# Patient Record
Sex: Female | Born: 1945 | Race: Black or African American | Hispanic: No | Marital: Married | Smoking: Former smoker
Health system: Southern US, Community
[De-identification: ages and names within clinical notes are randomized; demographics above are authoritative.]

## PROBLEM LIST (undated history)

## (undated) DIAGNOSIS — I10 Essential (primary) hypertension: Secondary | ICD-10-CM

## (undated) DIAGNOSIS — R7303 Prediabetes: Secondary | ICD-10-CM

## (undated) DIAGNOSIS — E039 Hypothyroidism, unspecified: Secondary | ICD-10-CM

## (undated) HISTORY — PX: BREAST EXCISIONAL BIOPSY: SUR124

---

## 1991-05-10 HISTORY — PX: BREAST EXCISIONAL BIOPSY: SUR124

## 1993-05-09 HISTORY — PX: BREAST EXCISIONAL BIOPSY: SUR124

## 1998-01-28 ENCOUNTER — Ambulatory Visit (HOSPITAL_COMMUNITY): Admission: RE | Admit: 1998-01-28 | Discharge: 1998-01-28 | Payer: Self-pay | Admitting: Family Medicine

## 1998-01-29 ENCOUNTER — Encounter: Payer: Self-pay | Admitting: Family Medicine

## 1998-02-25 ENCOUNTER — Ambulatory Visit (HOSPITAL_COMMUNITY): Admission: RE | Admit: 1998-02-25 | Discharge: 1998-02-25 | Payer: Self-pay | Admitting: Family Medicine

## 1998-02-25 ENCOUNTER — Encounter: Payer: Self-pay | Admitting: Family Medicine

## 1998-04-01 ENCOUNTER — Encounter: Payer: Self-pay | Admitting: Surgery

## 1998-04-01 ENCOUNTER — Observation Stay (HOSPITAL_COMMUNITY): Admission: RE | Admit: 1998-04-01 | Discharge: 1998-04-02 | Payer: Self-pay | Admitting: Surgery

## 1999-06-18 ENCOUNTER — Encounter: Payer: Self-pay | Admitting: Family Medicine

## 1999-06-18 ENCOUNTER — Encounter: Admission: RE | Admit: 1999-06-18 | Discharge: 1999-06-18 | Payer: Self-pay | Admitting: Family Medicine

## 1999-12-20 ENCOUNTER — Encounter: Payer: Self-pay | Admitting: Family Medicine

## 1999-12-20 ENCOUNTER — Encounter: Admission: RE | Admit: 1999-12-20 | Discharge: 1999-12-20 | Payer: Self-pay | Admitting: Surgery

## 2001-01-11 ENCOUNTER — Encounter: Payer: Self-pay | Admitting: Family Medicine

## 2001-01-11 ENCOUNTER — Encounter: Admission: RE | Admit: 2001-01-11 | Discharge: 2001-01-11 | Payer: Self-pay | Admitting: Family Medicine

## 2002-01-16 ENCOUNTER — Encounter: Payer: Self-pay | Admitting: Family Medicine

## 2002-01-16 ENCOUNTER — Encounter: Admission: RE | Admit: 2002-01-16 | Discharge: 2002-01-16 | Payer: Self-pay | Admitting: Family Medicine

## 2002-11-06 ENCOUNTER — Encounter: Admission: RE | Admit: 2002-11-06 | Discharge: 2003-02-04 | Payer: Self-pay | Admitting: Family Medicine

## 2003-01-20 ENCOUNTER — Encounter: Payer: Self-pay | Admitting: Family Medicine

## 2003-01-20 ENCOUNTER — Encounter: Admission: RE | Admit: 2003-01-20 | Discharge: 2003-01-20 | Payer: Self-pay | Admitting: Family Medicine

## 2004-01-23 ENCOUNTER — Encounter: Admission: RE | Admit: 2004-01-23 | Discharge: 2004-01-23 | Payer: Self-pay | Admitting: Family Medicine

## 2004-01-28 ENCOUNTER — Encounter: Admission: RE | Admit: 2004-01-28 | Discharge: 2004-01-28 | Payer: Self-pay | Admitting: Family Medicine

## 2004-02-23 ENCOUNTER — Other Ambulatory Visit: Admission: RE | Admit: 2004-02-23 | Discharge: 2004-02-23 | Payer: Self-pay | Admitting: Family Medicine

## 2004-07-12 ENCOUNTER — Encounter: Admission: RE | Admit: 2004-07-12 | Discharge: 2004-07-12 | Payer: Self-pay | Admitting: Family Medicine

## 2004-12-27 ENCOUNTER — Encounter: Admission: RE | Admit: 2004-12-27 | Discharge: 2004-12-27 | Payer: Self-pay | Admitting: Family Medicine

## 2005-01-21 ENCOUNTER — Encounter: Admission: RE | Admit: 2005-01-21 | Discharge: 2005-01-21 | Payer: Self-pay | Admitting: Family Medicine

## 2005-03-22 ENCOUNTER — Other Ambulatory Visit: Admission: RE | Admit: 2005-03-22 | Discharge: 2005-03-22 | Payer: Self-pay | Admitting: Family Medicine

## 2006-01-23 ENCOUNTER — Encounter: Admission: RE | Admit: 2006-01-23 | Discharge: 2006-01-23 | Payer: Self-pay | Admitting: Family Medicine

## 2006-01-30 ENCOUNTER — Encounter: Admission: RE | Admit: 2006-01-30 | Discharge: 2006-01-30 | Payer: Self-pay | Admitting: Family Medicine

## 2006-02-03 ENCOUNTER — Emergency Department (HOSPITAL_COMMUNITY): Admission: EM | Admit: 2006-02-03 | Discharge: 2006-02-03 | Payer: Self-pay | Admitting: Family Medicine

## 2006-03-27 ENCOUNTER — Other Ambulatory Visit: Admission: RE | Admit: 2006-03-27 | Discharge: 2006-03-27 | Payer: Self-pay | Admitting: Family Medicine

## 2006-07-10 ENCOUNTER — Encounter: Admission: RE | Admit: 2006-07-10 | Discharge: 2006-07-10 | Payer: Self-pay | Admitting: Family Medicine

## 2007-01-26 ENCOUNTER — Encounter: Admission: RE | Admit: 2007-01-26 | Discharge: 2007-01-26 | Payer: Self-pay | Admitting: Family Medicine

## 2008-01-28 ENCOUNTER — Encounter: Admission: RE | Admit: 2008-01-28 | Discharge: 2008-01-28 | Payer: Self-pay | Admitting: Family Medicine

## 2008-04-07 ENCOUNTER — Other Ambulatory Visit: Admission: RE | Admit: 2008-04-07 | Discharge: 2008-04-07 | Payer: Self-pay | Admitting: Family Medicine

## 2009-01-28 ENCOUNTER — Encounter: Admission: RE | Admit: 2009-01-28 | Discharge: 2009-01-28 | Payer: Self-pay | Admitting: Family Medicine

## 2009-02-05 ENCOUNTER — Encounter: Admission: RE | Admit: 2009-02-05 | Discharge: 2009-02-05 | Payer: Self-pay | Admitting: Family Medicine

## 2009-04-09 ENCOUNTER — Other Ambulatory Visit: Admission: RE | Admit: 2009-04-09 | Discharge: 2009-04-09 | Payer: Self-pay | Admitting: Family Medicine

## 2010-02-08 ENCOUNTER — Encounter: Admission: RE | Admit: 2010-02-08 | Discharge: 2010-02-08 | Payer: Self-pay | Admitting: Family Medicine

## 2010-05-30 ENCOUNTER — Encounter: Payer: Self-pay | Admitting: Family Medicine

## 2011-01-03 ENCOUNTER — Other Ambulatory Visit: Payer: Self-pay | Admitting: Family Medicine

## 2011-01-03 DIAGNOSIS — Z1231 Encounter for screening mammogram for malignant neoplasm of breast: Secondary | ICD-10-CM

## 2011-02-11 ENCOUNTER — Ambulatory Visit
Admission: RE | Admit: 2011-02-11 | Discharge: 2011-02-11 | Disposition: A | Discharge status: Home / Self Care | Payer: Managed Care, Other (non HMO) | Source: Ambulatory Visit | Attending: Family Medicine | Admitting: Family Medicine

## 2011-02-11 DIAGNOSIS — Z1231 Encounter for screening mammogram for malignant neoplasm of breast: Secondary | ICD-10-CM

## 2012-01-02 ENCOUNTER — Other Ambulatory Visit: Payer: Self-pay | Admitting: Family Medicine

## 2012-01-02 DIAGNOSIS — Z1231 Encounter for screening mammogram for malignant neoplasm of breast: Secondary | ICD-10-CM

## 2012-02-13 ENCOUNTER — Ambulatory Visit
Admission: RE | Admit: 2012-02-13 | Discharge: 2012-02-13 | Disposition: A | Discharge status: Home / Self Care | Payer: Medicare Other | Source: Ambulatory Visit | Attending: Family Medicine | Admitting: Family Medicine

## 2012-02-13 DIAGNOSIS — Z1231 Encounter for screening mammogram for malignant neoplasm of breast: Secondary | ICD-10-CM

## 2013-01-14 ENCOUNTER — Other Ambulatory Visit: Payer: Self-pay

## 2013-01-14 DIAGNOSIS — Z1231 Encounter for screening mammogram for malignant neoplasm of breast: Secondary | ICD-10-CM

## 2013-02-13 ENCOUNTER — Ambulatory Visit
Admission: RE | Admit: 2013-02-13 | Discharge: 2013-02-13 | Disposition: A | Discharge status: Home / Self Care | Payer: Medicare Other | Source: Ambulatory Visit

## 2013-02-13 DIAGNOSIS — Z1231 Encounter for screening mammogram for malignant neoplasm of breast: Secondary | ICD-10-CM

## 2014-01-14 ENCOUNTER — Other Ambulatory Visit: Payer: Self-pay

## 2014-01-14 DIAGNOSIS — Z1231 Encounter for screening mammogram for malignant neoplasm of breast: Secondary | ICD-10-CM

## 2014-02-14 ENCOUNTER — Ambulatory Visit
Admission: RE | Admit: 2014-02-14 | Discharge: 2014-02-14 | Disposition: A | Discharge status: Home / Self Care | Payer: Medicare Other | Source: Ambulatory Visit

## 2014-02-14 DIAGNOSIS — Z1231 Encounter for screening mammogram for malignant neoplasm of breast: Secondary | ICD-10-CM

## 2015-01-09 ENCOUNTER — Other Ambulatory Visit: Payer: Self-pay

## 2015-01-09 DIAGNOSIS — Z1231 Encounter for screening mammogram for malignant neoplasm of breast: Secondary | ICD-10-CM

## 2015-02-19 ENCOUNTER — Ambulatory Visit
Admission: RE | Admit: 2015-02-19 | Discharge: 2015-02-19 | Disposition: A | Discharge status: Home / Self Care | Payer: Medicare Other | Source: Ambulatory Visit

## 2015-02-19 DIAGNOSIS — Z1231 Encounter for screening mammogram for malignant neoplasm of breast: Secondary | ICD-10-CM

## 2016-01-19 ENCOUNTER — Other Ambulatory Visit: Payer: Self-pay | Admitting: Family Medicine

## 2016-01-19 DIAGNOSIS — Z1231 Encounter for screening mammogram for malignant neoplasm of breast: Secondary | ICD-10-CM

## 2016-02-22 ENCOUNTER — Encounter: Payer: Self-pay | Admitting: Radiology

## 2016-02-22 ENCOUNTER — Ambulatory Visit
Admission: RE | Admit: 2016-02-22 | Discharge: 2016-02-22 | Disposition: A | Discharge status: Home / Self Care | Payer: Medicare Other | Source: Ambulatory Visit | Attending: Family Medicine | Admitting: Family Medicine

## 2016-02-22 DIAGNOSIS — Z1231 Encounter for screening mammogram for malignant neoplasm of breast: Secondary | ICD-10-CM

## 2016-05-27 ENCOUNTER — Ambulatory Visit
Admission: RE | Admit: 2016-05-27 | Discharge: 2016-05-27 | Disposition: A | Discharge status: Home / Self Care | Payer: Medicare Other | Source: Ambulatory Visit | Attending: Physician Assistant | Admitting: Physician Assistant

## 2016-05-27 ENCOUNTER — Other Ambulatory Visit: Payer: Self-pay | Admitting: Physician Assistant

## 2016-05-27 DIAGNOSIS — T1490XA Injury, unspecified, initial encounter: Secondary | ICD-10-CM

## 2017-01-12 ENCOUNTER — Other Ambulatory Visit: Payer: Self-pay | Admitting: Family Medicine

## 2017-01-12 DIAGNOSIS — Z1231 Encounter for screening mammogram for malignant neoplasm of breast: Secondary | ICD-10-CM

## 2017-02-22 ENCOUNTER — Ambulatory Visit
Admission: RE | Admit: 2017-02-22 | Discharge: 2017-02-22 | Disposition: A | Discharge status: Home / Self Care | Payer: Medicare Other | Source: Ambulatory Visit | Attending: Family Medicine | Admitting: Family Medicine

## 2017-02-22 DIAGNOSIS — Z1231 Encounter for screening mammogram for malignant neoplasm of breast: Secondary | ICD-10-CM

## 2017-05-15 DIAGNOSIS — R7303 Prediabetes: Secondary | ICD-10-CM | POA: Diagnosis not present

## 2017-05-15 DIAGNOSIS — E669 Obesity, unspecified: Secondary | ICD-10-CM | POA: Diagnosis not present

## 2017-05-15 DIAGNOSIS — E7800 Pure hypercholesterolemia, unspecified: Secondary | ICD-10-CM | POA: Diagnosis not present

## 2017-05-15 DIAGNOSIS — N183 Chronic kidney disease, stage 3 (moderate): Secondary | ICD-10-CM | POA: Diagnosis not present

## 2017-05-15 DIAGNOSIS — I129 Hypertensive chronic kidney disease with stage 1 through stage 4 chronic kidney disease, or unspecified chronic kidney disease: Secondary | ICD-10-CM | POA: Diagnosis not present

## 2017-05-15 DIAGNOSIS — E039 Hypothyroidism, unspecified: Secondary | ICD-10-CM | POA: Diagnosis not present

## 2017-05-15 DIAGNOSIS — M8580 Other specified disorders of bone density and structure, unspecified site: Secondary | ICD-10-CM | POA: Diagnosis not present

## 2017-06-05 DIAGNOSIS — H524 Presbyopia: Secondary | ICD-10-CM | POA: Diagnosis not present

## 2017-06-05 DIAGNOSIS — H2513 Age-related nuclear cataract, bilateral: Secondary | ICD-10-CM | POA: Diagnosis not present

## 2017-06-05 DIAGNOSIS — H52203 Unspecified astigmatism, bilateral: Secondary | ICD-10-CM | POA: Diagnosis not present

## 2017-06-05 DIAGNOSIS — E119 Type 2 diabetes mellitus without complications: Secondary | ICD-10-CM | POA: Diagnosis not present

## 2017-07-12 DIAGNOSIS — M8588 Other specified disorders of bone density and structure, other site: Secondary | ICD-10-CM | POA: Diagnosis not present

## 2017-09-26 DIAGNOSIS — Z0100 Encounter for examination of eyes and vision without abnormal findings: Secondary | ICD-10-CM | POA: Diagnosis not present

## 2017-10-26 DIAGNOSIS — M810 Age-related osteoporosis without current pathological fracture: Secondary | ICD-10-CM | POA: Diagnosis not present

## 2017-10-26 DIAGNOSIS — Z0000 Encounter for general adult medical examination without abnormal findings: Secondary | ICD-10-CM | POA: Diagnosis not present

## 2017-10-26 DIAGNOSIS — I129 Hypertensive chronic kidney disease with stage 1 through stage 4 chronic kidney disease, or unspecified chronic kidney disease: Secondary | ICD-10-CM | POA: Diagnosis not present

## 2017-10-26 DIAGNOSIS — E039 Hypothyroidism, unspecified: Secondary | ICD-10-CM | POA: Diagnosis not present

## 2017-10-26 DIAGNOSIS — Z1389 Encounter for screening for other disorder: Secondary | ICD-10-CM | POA: Diagnosis not present

## 2017-10-26 DIAGNOSIS — G609 Hereditary and idiopathic neuropathy, unspecified: Secondary | ICD-10-CM | POA: Diagnosis not present

## 2017-10-26 DIAGNOSIS — E7800 Pure hypercholesterolemia, unspecified: Secondary | ICD-10-CM | POA: Diagnosis not present

## 2017-10-26 DIAGNOSIS — R7309 Other abnormal glucose: Secondary | ICD-10-CM | POA: Diagnosis not present

## 2017-10-26 DIAGNOSIS — N183 Chronic kidney disease, stage 3 (moderate): Secondary | ICD-10-CM | POA: Diagnosis not present

## 2018-01-01 DIAGNOSIS — E039 Hypothyroidism, unspecified: Secondary | ICD-10-CM | POA: Diagnosis not present

## 2018-01-01 DIAGNOSIS — N183 Chronic kidney disease, stage 3 (moderate): Secondary | ICD-10-CM | POA: Diagnosis not present

## 2018-01-12 ENCOUNTER — Other Ambulatory Visit: Payer: Self-pay | Admitting: Family Medicine

## 2018-01-12 DIAGNOSIS — Z1231 Encounter for screening mammogram for malignant neoplasm of breast: Secondary | ICD-10-CM

## 2018-02-09 DIAGNOSIS — Z23 Encounter for immunization: Secondary | ICD-10-CM | POA: Diagnosis not present

## 2018-02-26 ENCOUNTER — Ambulatory Visit
Admission: RE | Admit: 2018-02-26 | Discharge: 2018-02-26 | Disposition: A | Discharge status: Home / Self Care | Payer: Medicare HMO | Source: Ambulatory Visit | Attending: Family Medicine | Admitting: Family Medicine

## 2018-02-26 DIAGNOSIS — Z1231 Encounter for screening mammogram for malignant neoplasm of breast: Secondary | ICD-10-CM

## 2018-03-14 DIAGNOSIS — K625 Hemorrhage of anus and rectum: Secondary | ICD-10-CM | POA: Diagnosis not present

## 2019-01-09 ENCOUNTER — Other Ambulatory Visit: Payer: Self-pay

## 2019-01-09 DIAGNOSIS — Z20822 Contact with and (suspected) exposure to covid-19: Secondary | ICD-10-CM

## 2019-01-10 LAB — NOVEL CORONAVIRUS, NAA: SARS-CoV-2, NAA: NOT DETECTED

## 2019-01-11 ENCOUNTER — Telehealth: Payer: Self-pay | Admitting: Family Medicine

## 2019-01-11 NOTE — Telephone Encounter (Signed)
Negative COVID results given. Patient results "NOT Detected." Caller expressed understanding. ° °

## 2019-01-18 ENCOUNTER — Other Ambulatory Visit: Payer: Self-pay | Admitting: Family Medicine

## 2019-01-18 DIAGNOSIS — Z1231 Encounter for screening mammogram for malignant neoplasm of breast: Secondary | ICD-10-CM

## 2019-03-06 ENCOUNTER — Other Ambulatory Visit: Payer: Self-pay

## 2019-03-06 ENCOUNTER — Ambulatory Visit
Admission: RE | Admit: 2019-03-06 | Discharge: 2019-03-06 | Disposition: A | Discharge status: Home / Self Care | Payer: Medicare Other | Source: Ambulatory Visit | Attending: Family Medicine | Admitting: Family Medicine

## 2019-03-06 DIAGNOSIS — Z1231 Encounter for screening mammogram for malignant neoplasm of breast: Secondary | ICD-10-CM

## 2019-03-08 ENCOUNTER — Other Ambulatory Visit: Payer: Self-pay | Admitting: Family Medicine

## 2019-03-08 DIAGNOSIS — R928 Other abnormal and inconclusive findings on diagnostic imaging of breast: Secondary | ICD-10-CM

## 2019-03-13 ENCOUNTER — Ambulatory Visit
Admission: RE | Admit: 2019-03-13 | Discharge: 2019-03-13 | Disposition: A | Discharge status: Home / Self Care | Payer: Medicare Other | Source: Ambulatory Visit | Attending: Family Medicine | Admitting: Family Medicine

## 2019-03-13 ENCOUNTER — Other Ambulatory Visit: Payer: Self-pay | Admitting: Family Medicine

## 2019-03-13 ENCOUNTER — Other Ambulatory Visit: Payer: Self-pay

## 2019-03-13 DIAGNOSIS — R928 Other abnormal and inconclusive findings on diagnostic imaging of breast: Secondary | ICD-10-CM

## 2019-03-13 DIAGNOSIS — N6310 Unspecified lump in the right breast, unspecified quadrant: Secondary | ICD-10-CM

## 2019-03-19 ENCOUNTER — Ambulatory Visit
Admission: RE | Admit: 2019-03-19 | Discharge: 2019-03-19 | Disposition: A | Discharge status: Home / Self Care | Payer: Medicare Other | Source: Ambulatory Visit | Attending: Family Medicine | Admitting: Family Medicine

## 2019-03-19 ENCOUNTER — Other Ambulatory Visit: Payer: Self-pay

## 2019-03-19 DIAGNOSIS — N6310 Unspecified lump in the right breast, unspecified quadrant: Secondary | ICD-10-CM

## 2019-03-19 HISTORY — PX: BREAST BIOPSY: SHX20

## 2019-03-29 ENCOUNTER — Ambulatory Visit: Payer: Self-pay | Admitting: Surgery

## 2019-03-29 DIAGNOSIS — N6310 Unspecified lump in the right breast, unspecified quadrant: Secondary | ICD-10-CM

## 2019-03-29 NOTE — H&P (Signed)
History of Present Illness Loretta York. Loretta Lotito York; 03/29/2019 12:30 PM) The patient is a 73 year old female who presents with a breast mass. This is a 73 year old female in good health who presents after recent finding York a routine screening mammogram. She underwent further workup that showed a 5 mm mass at 3:00 in the right breast. 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 She is referred to [place] for surgical evaluation. The patient had a lumpectomy in 1993 that was reportedly benign. She had another core biopsy York the left side in 1995 but has had no other abnormal findings York mammogram since that time. She has no family history of breast cancer.   CLINICAL DATA: Screening recall for possible right breast mass.  EXAM: DIGITAL DIAGNOSTIC RIGHT MAMMOGRAM WITH CAD AND TOMO  ULTRASOUND RIGHT BREAST  COMPARISON: Previous exam(s).  ACR Breast Density Category b: There are scattered areas of fibroglandular density.  FINDINGS: The mass noted in the medial right breast persists York the diagnostic spot-compression images. It is lobulated, approximately 5 mm in size, mostly circumscribed margins. No associated architectural distortion or calcifications.  Mammographic images were processed with CAD.  Targeted ultrasound is performed, showing a hypoechoic mass in the right breast at 3 o'clock, with mostly circumscribed margins, oval in shape, measuring 5 x 2 x 3 mm, consistent in size, shape and location to the mammographic finding. There are some internal echoes. No documented internal blood flow York color Doppler analysis.  Sonographic evaluation of the right axilla shows no enlarged or abnormal lymph nodes.  IMPRESSION: 1. Indeterminate 5 mm mass in the 3 o'clock position of the right breast. Biopsy is recommended.  RECOMMENDATION: Ultrasound-guided core needle biopsy of the small mass in the  medial right breast. This procedure was scheduled for the next available appointment prior to the patient leaving the breast Center.  I have discussed the findings and recommendations with the patient. If applicable, a reminder letter will be sent to the patient regarding the next appointment.  BI-RADS CATEGORY 4: Suspicious.   Electronically Signed By: Loretta York York. York: 03/13/2019 14:34  CLINICAL DATA: Biopsy of a 3 o'clock right breast mass  EXAM: ULTRASOUND GUIDED RIGHT BREAST CORE NEEDLE BIOPSY  COMPARISON: Previous exam(s).  FINDINGS: I met with the patient and we discussed the procedure of ultrasound-guided biopsy, including benefits and alternatives. We discussed the high likelihood of a successful procedure. We discussed the risks of the procedure, including infection, bleeding, tissue injury, clip migration, and inadequate sampling. Informed written consent was given. The usual time-out protocol was performed immediately prior to the procedure.  Lesion quadrant: 3 o'clock  Using sterile technique and 1% Lidocaine as local anesthetic, under direct ultrasound visualization, a 12 gauge spring-loaded device was used to perform biopsy of a right breast mass using a medial approach. At the conclusion of the procedure tissue marker clip was deployed into the biopsy cavity. Follow up 2 view mammogram was performed and dictated separately.  IMPRESSION: Ultrasound guided biopsy of a right breast mass. No apparent complications.  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 This was found to be concordant by Dr. Dorise York.  Pathology results were discussed with the  patient by telephone. The patient  reported doing well after the biopsy with tenderness at the site. Post biopsy instructions and care were reviewed and questions were answered. The patient was encouraged to call [facility] for any additional concerns.  Surgical consultation has been arranged with Dr. Donnie [facility] at [facility] March 29, 2019.  Pathology results reported by Loretta York York 03/20/2019.   Electronically Signed By: Loretta York: 03/20/2019 15:04    Problem List/Past Medical Loretta York. Loretta Pistilli, York; 03/29/2019 12:30 PM) BREAST MASS, RIGHT (N63.10)  Past Surgical History Loretta York, Loretta York; 03/29/2019 10:51 AM) Breast Biopsy Right. Colon Polyp Removal - Colonoscopy Hysterectomy (not due to cancer) - Complete Thyroid Surgery  Diagnostic Studies History Loretta York, Loretta York; 03/29/2019 10:51 AM) Colonoscopy 1-5 years ago Mammogram 1-3 years ago  Allergies Loretta York, Loretta York; 03/29/2019 10:52 AM) Aspirin *ANALGESICS - NonNarcotic* Allergies Reconciled  Medication History Loretta York, Loretta York; 03/29/2019 10:53 AM) Simvastatin (20MG Tablet, Oral) Active. [facility] Packet, Oral) Active. Levothyroxine Sodium (150MCG Tablet, Oral) Active. amLODIPine Besylate (5MG Tablet, Oral) Active. Oscal 500/200 D-3 (500-200MG-UNIT Tablet, Oral) Active. Claritin (10MG Tablet, Oral) Active. Medications Reconciled  Social History Loretta York, Loretta York; 03/29/2019 10:52 AM) Alcohol use Remotely quit alcohol use. No caffeine use No drug use Tobacco use Never smoker.  Family History Loretta York, Loretta York; 03/29/2019 10:51 AM) Arthritis Mother. Cancer Family Members In General. Heart Disease Mother. Heart disease in female family member before age 95 Hypertension Mother.  Pregnancy / Birth History Loretta York, Loretta York; 03/29/2019 10:52 AM) Age at menarche 30 years. Age  of menopause 47-55 Gravida 0 Para 0  Other Problems Loretta York. Loretta York; 03/29/2019 12:30 PM) Arthritis Diabetes Mellitus Diverticulosis High blood pressure Lump In Breast     Review of Systems Loretta York Loretta York; 03/29/2019 10:52 AM) General Not Present- Appetite Loss, Chills, Fatigue, Fever, Night Sweats, Weight Gain and Weight Loss. Skin Not Present- Change in Wart/Mole, Dryness, Hives, Jaundice, New Lesions, Non-Healing Wounds, Rash and Ulcer. HEENT Present- Seasonal Allergies and Wears glasses/contact lenses. Not Present- Earache, Hearing Loss, Hoarseness, Nose Bleed, Oral Ulcers, Ringing in the Ears, Sinus Pain, Sore Throat, Visual Disturbances and Yellow Eyes. Respiratory Not Present- Bloody sputum, Chronic Cough, Difficulty Breathing, Snoring and Wheezing. Breast Not Present- Breast Mass, Breast Pain, Nipple Discharge and Skin Changes. Cardiovascular Present- Swelling of Extremities. Not Present- Chest Pain, Difficulty Breathing Lying Down, Leg Cramps, Palpitations, Rapid Heart Rate and Shortness of Breath. Gastrointestinal Not Present- Abdominal Pain, Bloating, Bloody Stool, Change in Bowel Habits, Chronic diarrhea, Constipation, Difficulty Swallowing, Excessive gas, Gets full quickly at meals, Hemorrhoids, Indigestion, Nausea, Rectal Pain and Vomiting. Female Genitourinary Not Present- Frequency, Nocturia, Painful Urination, Pelvic Pain and Urgency. Musculoskeletal Not Present- Back Pain, Joint Pain, Joint Stiffness, Muscle Pain, Muscle Weakness and Swelling of Extremities. Neurological Not Present- Decreased Memory, Fainting, Headaches, Numbness, Seizures, Tingling, Tremor, Trouble walking and Weakness. Psychiatric Not Present- Anxiety, Bipolar, Change in Sleep Pattern, Depression, Fearful and Frequent crying. Endocrine Not Present- Cold Intolerance, Excessive Hunger, Hair Changes, Heat Intolerance, Hot flashes and New Diabetes. Hematology Not Present- Blood  Thinners, Easy Bruising, Excessive bleeding, Gland problems, HIV and Persistent Infections.  Vitals Loretta York Loretta York; 03/29/2019 10:52 AM) 03/29/2019 10:52 AM Weight: 180.2 lb Height: 63in Body Surface Area: 1.85 m Body Mass Index: 31.92 kg/m  Temp.: 97.65F  Pulse: 85 (Regular)  BP: 158/86 (Sitting, Left Arm, Standard)        Physical Exam Rodman Key K. Burel Kahre York; 03/29/2019 12:30 PM)  The  physical exam findings are as follows: Note:WDWN in NAD Eyes: Pupils equal, round; sclera anicteric HENT: Oral mucosa moist; good dentition Neck: No masses palpated, no thyromegaly Lungs: CTA bilaterally; normal respiratory effort Breasts: Symmetric, no nipple retraction or discharge, no axillary lymphadenopathy, healing biopsy site medial right breast, no dominant masses York either side. CV: Regular rate and rhythm; no murmurs; extremities well-perfused with no edema Abd: +bowel sounds, soft, non-tender, no palpable organomegaly; no palpable hernias Skin: Warm, dry; no sign of jaundice Psychiatric - alert and oriented x 4; calm mood and affect    Assessment & Plan Rodman Key K. Kevron Patella York; 03/29/2019 11:17 AM)  BREAST MASS, RIGHT (N63.10) Impression: Complex sclerosing lesion right breast 38m 3:00  Current Plans Schedule for Surgery - Right radioactive seed localized lumpectomy. The surgical procedure has been discussed with the patient. Potential risks, benefits, alternative treatments, and expected outcomes have been explained. All of the patient's questions at this time have been answered. The likelihood of reaching the patient's treatment goal is good. The patient understand the proposed surgical procedure and wishes to proceed.  MImogene York TGeorgette Dover York, FCentra Health [facility] Baptist HospitalSurgery  General/ Trauma Surgery   03/29/2019 12:31 PM

## 2019-04-03 ENCOUNTER — Other Ambulatory Visit: Payer: Self-pay | Admitting: Surgery

## 2019-04-03 DIAGNOSIS — N6310 Unspecified lump in the right breast, unspecified quadrant: Secondary | ICD-10-CM

## 2019-05-01 ENCOUNTER — Encounter (HOSPITAL_BASED_OUTPATIENT_CLINIC_OR_DEPARTMENT_OTHER): Payer: Self-pay | Admitting: Surgery

## 2019-05-01 ENCOUNTER — Other Ambulatory Visit: Payer: Self-pay

## 2019-05-11 ENCOUNTER — Other Ambulatory Visit (HOSPITAL_COMMUNITY)
Admission: RE | Admit: 2019-05-11 | Discharge: 2019-05-11 | Disposition: A | Discharge status: Home / Self Care | Payer: Medicare Other | Source: Ambulatory Visit | Attending: Surgery | Admitting: Surgery

## 2019-05-11 DIAGNOSIS — Z20822 Contact with and (suspected) exposure to covid-19: Secondary | ICD-10-CM | POA: Insufficient documentation

## 2019-05-11 DIAGNOSIS — Z01812 Encounter for preprocedural laboratory examination: Secondary | ICD-10-CM | POA: Insufficient documentation

## 2019-05-11 LAB — SARS CORONAVIRUS 2 (TAT 6-24 HRS): SARS Coronavirus 2: NEGATIVE

## 2019-05-14 ENCOUNTER — Encounter (HOSPITAL_BASED_OUTPATIENT_CLINIC_OR_DEPARTMENT_OTHER)
Admission: RE | Admit: 2019-05-14 | Discharge: 2019-05-14 | Disposition: A | Discharge status: Home / Self Care | Payer: Medicare Other | Source: Ambulatory Visit | Attending: Surgery | Admitting: Surgery

## 2019-05-14 ENCOUNTER — Other Ambulatory Visit: Payer: Self-pay

## 2019-05-14 ENCOUNTER — Ambulatory Visit
Admission: RE | Admit: 2019-05-14 | Discharge: 2019-05-14 | Disposition: A | Discharge status: Home / Self Care | Payer: Medicare Other | Source: Ambulatory Visit | Attending: Surgery | Admitting: Surgery

## 2019-05-14 DIAGNOSIS — Z79890 Hormone replacement therapy (postmenopausal): Secondary | ICD-10-CM | POA: Diagnosis not present

## 2019-05-14 DIAGNOSIS — Z79899 Other long term (current) drug therapy: Secondary | ICD-10-CM | POA: Diagnosis not present

## 2019-05-14 DIAGNOSIS — E039 Hypothyroidism, unspecified: Secondary | ICD-10-CM | POA: Diagnosis not present

## 2019-05-14 DIAGNOSIS — N6489 Other specified disorders of breast: Secondary | ICD-10-CM | POA: Diagnosis not present

## 2019-05-14 DIAGNOSIS — E119 Type 2 diabetes mellitus without complications: Secondary | ICD-10-CM | POA: Diagnosis not present

## 2019-05-14 DIAGNOSIS — N6310 Unspecified lump in the right breast, unspecified quadrant: Secondary | ICD-10-CM

## 2019-05-14 DIAGNOSIS — D241 Benign neoplasm of right breast: Secondary | ICD-10-CM | POA: Diagnosis not present

## 2019-05-14 DIAGNOSIS — N649 Disorder of breast, unspecified: Secondary | ICD-10-CM | POA: Diagnosis present

## 2019-05-14 DIAGNOSIS — I10 Essential (primary) hypertension: Secondary | ICD-10-CM | POA: Diagnosis not present

## 2019-05-14 DIAGNOSIS — Z87891 Personal history of nicotine dependence: Secondary | ICD-10-CM | POA: Diagnosis not present

## 2019-05-14 LAB — BASIC METABOLIC PANEL
Anion gap: 12 (ref 5–15)
BUN: 19 mg/dL (ref 8–23)
CO2: 25 mmol/L (ref 22–32)
Calcium: 10.2 mg/dL (ref 8.9–10.3)
Chloride: 104 mmol/L (ref 98–111)
Creatinine, Ser: 1.25 mg/dL — ABNORMAL HIGH (ref 0.44–1.00)
GFR calc Af Amer: 50 mL/min — ABNORMAL LOW (ref >60)
GFR calc non Af Amer: 43 mL/min — ABNORMAL LOW (ref >60)
Glucose, Bld: 127 mg/dL — ABNORMAL HIGH (ref 70–99)
Potassium: 3.9 mmol/L (ref 3.5–5.1)
Sodium: 141 mmol/L (ref 135–145)

## 2019-05-14 NOTE — Anesthesia Preprocedure Evaluation (Addendum)
Anesthesia Evaluation  Patient identified by MRN, date of birth, ID band Patient awake    Reviewed: Allergy & Precautions, NPO status , Patient's Chart, lab work & pertinent test results  Airway Mallampati: I       Dental no notable dental hx. (+) Teeth Intact   Pulmonary former smoker,    Pulmonary exam normal breath sounds clear to auscultation       Cardiovascular hypertension, Pt. on medications Normal cardiovascular exam Rhythm:Regular Rate:Normal     Neuro/Psych negative neurological ROS  negative psych ROS   GI/Hepatic negative GI ROS, Neg liver ROS,   Endo/Other  Hypothyroidism   Renal/GU      Musculoskeletal negative musculoskeletal ROS (+)   Abdominal Normal abdominal exam  (+)   Peds  Hematology   Anesthesia Other Findings   Reproductive/Obstetrics                            Anesthesia Physical Anesthesia Plan  ASA: II  Anesthesia Plan: General   Post-op Pain Management:    Induction: Intravenous  PONV Risk Score and Plan: 3 and Ondansetron and Treatment may vary due to age or medical condition  Airway Management Planned: LMA  Additional Equipment: None  Intra-op Plan:   Post-operative Plan: Extubation in OR  Informed Consent: I have reviewed the patients History and Physical, chart, labs and discussed the procedure including the risks, benefits and alternatives for the proposed anesthesia with the patient or authorized representative who has indicated his/her understanding and acceptance.     Dental advisory given  Plan Discussed with:   Anesthesia Plan Comments:        Anesthesia Quick Evaluation

## 2019-05-14 NOTE — Progress Notes (Signed)

## 2019-05-15 ENCOUNTER — Ambulatory Visit (HOSPITAL_BASED_OUTPATIENT_CLINIC_OR_DEPARTMENT_OTHER)
Admission: RE | Admit: 2019-05-15 | Discharge: 2019-05-15 | Disposition: A | Discharge status: Home / Self Care | Payer: Medicare Other | Attending: Surgery | Admitting: Surgery

## 2019-05-15 ENCOUNTER — Other Ambulatory Visit: Payer: Self-pay

## 2019-05-15 ENCOUNTER — Encounter (HOSPITAL_BASED_OUTPATIENT_CLINIC_OR_DEPARTMENT_OTHER): Payer: Self-pay | Admitting: Surgery

## 2019-05-15 ENCOUNTER — Ambulatory Visit
Admission: RE | Admit: 2019-05-15 | Discharge: 2019-05-15 | Disposition: A | Discharge status: Home / Self Care | Payer: Medicare Other | Source: Ambulatory Visit | Attending: Surgery | Admitting: Surgery

## 2019-05-15 ENCOUNTER — Ambulatory Visit (HOSPITAL_BASED_OUTPATIENT_CLINIC_OR_DEPARTMENT_OTHER): Payer: Medicare Other | Admitting: Anesthesiology

## 2019-05-15 ENCOUNTER — Encounter (HOSPITAL_BASED_OUTPATIENT_CLINIC_OR_DEPARTMENT_OTHER)
Admission: RE | Disposition: A | Discharge status: Home / Self Care | Payer: Self-pay | Source: Home / Self Care | Attending: Surgery

## 2019-05-15 DIAGNOSIS — I10 Essential (primary) hypertension: Secondary | ICD-10-CM | POA: Diagnosis not present

## 2019-05-15 DIAGNOSIS — E119 Type 2 diabetes mellitus without complications: Secondary | ICD-10-CM | POA: Insufficient documentation

## 2019-05-15 DIAGNOSIS — Z79890 Hormone replacement therapy (postmenopausal): Secondary | ICD-10-CM | POA: Insufficient documentation

## 2019-05-15 DIAGNOSIS — D241 Benign neoplasm of right breast: Secondary | ICD-10-CM | POA: Diagnosis not present

## 2019-05-15 DIAGNOSIS — N6489 Other specified disorders of breast: Secondary | ICD-10-CM | POA: Insufficient documentation

## 2019-05-15 DIAGNOSIS — E039 Hypothyroidism, unspecified: Secondary | ICD-10-CM | POA: Insufficient documentation

## 2019-05-15 DIAGNOSIS — Z79899 Other long term (current) drug therapy: Secondary | ICD-10-CM | POA: Insufficient documentation

## 2019-05-15 DIAGNOSIS — Z87891 Personal history of nicotine dependence: Secondary | ICD-10-CM | POA: Insufficient documentation

## 2019-05-15 DIAGNOSIS — N6310 Unspecified lump in the right breast, unspecified quadrant: Secondary | ICD-10-CM

## 2019-05-15 HISTORY — DX: Hypothyroidism, unspecified: E03.9

## 2019-05-15 HISTORY — DX: Essential (primary) hypertension: I10

## 2019-05-15 HISTORY — PX: BREAST LUMPECTOMY WITH RADIOACTIVE SEED LOCALIZATION: SHX6424

## 2019-05-15 HISTORY — DX: Prediabetes: R73.03

## 2019-05-15 HISTORY — PX: BREAST EXCISIONAL BIOPSY: SUR124

## 2019-05-15 SURGERY — BREAST LUMPECTOMY WITH RADIOACTIVE SEED LOCALIZATION
Anesthesia: General | Site: Breast | Laterality: Right

## 2019-05-15 MED ORDER — MEPERIDINE HCL 25 MG/ML IJ SOLN: 6.2500 mg | INTRAMUSCULAR | Status: DC | PRN

## 2019-05-15 MED ORDER — GABAPENTIN 300 MG PO CAPS
300.0000 mg | ORAL_CAPSULE | ORAL | Status: AC
  Administered 2019-05-15: 08:00:00 300 mg via ORAL

## 2019-05-15 MED ORDER — DEXAMETHASONE SODIUM PHOSPHATE 10 MG/ML IJ SOLN
INTRAMUSCULAR | Status: AC
  Filled 2019-05-15: qty 1

## 2019-05-15 MED ORDER — ONDANSETRON HCL 4 MG/2ML IJ SOLN
INTRAMUSCULAR | Status: AC
  Filled 2019-05-15: qty 2

## 2019-05-15 MED ORDER — CEFAZOLIN SODIUM-DEXTROSE 2-4 GM/100ML-% IV SOLN
2.0000 g | INTRAVENOUS | Status: AC
  Administered 2019-05-15: 2 g via INTRAVENOUS

## 2019-05-15 MED ORDER — CHLORHEXIDINE GLUCONATE CLOTH 2 % EX PADS: 6.0000 | MEDICATED_PAD | Freq: Once | CUTANEOUS | Status: DC

## 2019-05-15 MED ORDER — HYDROCODONE-ACETAMINOPHEN 5-325 MG PO TABS: 1.0000 | ORAL_TABLET | Freq: Four times a day (QID) | ORAL | 0 refills | Status: AC | PRN

## 2019-05-15 MED ORDER — ACETAMINOPHEN 160 MG/5ML PO SOLN: 325.0000 mg | ORAL | Status: DC | PRN

## 2019-05-15 MED ORDER — PROPOFOL 10 MG/ML IV BOLUS
INTRAVENOUS | Status: DC | PRN
  Administered 2019-05-15: 150 mg via INTRAVENOUS

## 2019-05-15 MED ORDER — PROPOFOL 10 MG/ML IV BOLUS
INTRAVENOUS | Status: AC
  Filled 2019-05-15: qty 20

## 2019-05-15 MED ORDER — BUPIVACAINE-EPINEPHRINE 0.25% -1:200000 IJ SOLN
INTRAMUSCULAR | Status: DC | PRN
  Administered 2019-05-15: 10 mL

## 2019-05-15 MED ORDER — CEFAZOLIN SODIUM-DEXTROSE 2-4 GM/100ML-% IV SOLN
INTRAVENOUS | Status: AC
  Filled 2019-05-15: qty 100

## 2019-05-15 MED ORDER — DEXAMETHASONE SODIUM PHOSPHATE 10 MG/ML IJ SOLN
INTRAMUSCULAR | Status: DC | PRN
  Administered 2019-05-15: 10 mg via INTRAVENOUS

## 2019-05-15 MED ORDER — LIDOCAINE 2% (20 MG/ML) 5 ML SYRINGE
INTRAMUSCULAR | Status: AC
  Filled 2019-05-15: qty 5

## 2019-05-15 MED ORDER — LACTATED RINGERS IV SOLN: INTRAVENOUS | Status: DC

## 2019-05-15 MED ORDER — FENTANYL CITRATE (PF) 100 MCG/2ML IJ SOLN
50.0000 ug | INTRAMUSCULAR | Status: DC | PRN
  Administered 2019-05-15: 50 ug via INTRAVENOUS

## 2019-05-15 MED ORDER — OXYCODONE HCL 5 MG/5ML PO SOLN: 5.0000 mg | Freq: Once | ORAL | Status: DC | PRN

## 2019-05-15 MED ORDER — ACETAMINOPHEN 325 MG PO TABS: 325.0000 mg | ORAL_TABLET | ORAL | Status: DC | PRN

## 2019-05-15 MED ORDER — ONDANSETRON HCL 4 MG/2ML IJ SOLN
INTRAMUSCULAR | Status: DC | PRN
  Administered 2019-05-15: 4 mg via INTRAVENOUS

## 2019-05-15 MED ORDER — ACETAMINOPHEN 500 MG PO TABS
1000.0000 mg | ORAL_TABLET | ORAL | Status: AC
  Administered 2019-05-15: 1000 mg via ORAL

## 2019-05-15 MED ORDER — OXYCODONE HCL 5 MG PO TABS: 5.0000 mg | ORAL_TABLET | Freq: Once | ORAL | Status: DC | PRN

## 2019-05-15 MED ORDER — FENTANYL CITRATE (PF) 100 MCG/2ML IJ SOLN: 25.0000 ug | INTRAMUSCULAR | Status: DC | PRN

## 2019-05-15 MED ORDER — 0.9 % SODIUM CHLORIDE (POUR BTL) OPTIME
TOPICAL | Status: DC | PRN
  Administered 2019-05-15: 1000 mL

## 2019-05-15 MED ORDER — ONDANSETRON HCL 4 MG/2ML IJ SOLN: 4.0000 mg | Freq: Once | INTRAMUSCULAR | Status: DC | PRN

## 2019-05-15 MED ORDER — LIDOCAINE 2% (20 MG/ML) 5 ML SYRINGE
INTRAMUSCULAR | Status: DC | PRN
  Administered 2019-05-15: 50 mg via INTRAVENOUS

## 2019-05-15 MED ORDER — MIDAZOLAM HCL 2 MG/2ML IJ SOLN: 1.0000 mg | INTRAMUSCULAR | Status: DC | PRN

## 2019-05-15 MED ORDER — GABAPENTIN 300 MG PO CAPS
ORAL_CAPSULE | ORAL | Status: AC
  Filled 2019-05-15: qty 1

## 2019-05-15 MED ORDER — ACETAMINOPHEN 500 MG PO TABS
ORAL_TABLET | ORAL | Status: AC
  Filled 2019-05-15: qty 2

## 2019-05-15 MED ORDER — BUPIVACAINE HCL (PF) 0.25 % IJ SOLN
INTRAMUSCULAR | Status: AC
  Filled 2019-05-15: qty 30

## 2019-05-15 MED ORDER — FENTANYL CITRATE (PF) 100 MCG/2ML IJ SOLN
INTRAMUSCULAR | Status: AC
  Filled 2019-05-15: qty 2

## 2019-05-15 SURGICAL SUPPLY — 54 items
APL PRP STRL LF DISP 70% ISPRP (MISCELLANEOUS) ×1
APL SKNCLS STERI-STRIP NONHPOA (GAUZE/BANDAGES/DRESSINGS) ×1
BENZOIN TINCTURE PRP APPL 2/3 (GAUZE/BANDAGES/DRESSINGS) ×3 IMPLANT
BLADE HEX COATED 2.75 (ELECTRODE) ×3 IMPLANT
BLADE SURG 15 STRL LF DISP TIS (BLADE) ×1 IMPLANT
BLADE SURG 15 STRL SS (BLADE) ×3
CANISTER SUC SOCK COL 7IN (MISCELLANEOUS) IMPLANT
CANISTER SUCT 1200ML W/VALVE (MISCELLANEOUS) IMPLANT
CHLORAPREP W/TINT 26 (MISCELLANEOUS) ×3 IMPLANT
CLOSURE WOUND 1/2 X4 (GAUZE/BANDAGES/DRESSINGS) ×1
COVER BACK TABLE REUSABLE LG (DRAPES) ×3 IMPLANT
COVER MAYO STAND REUSABLE (DRAPES) ×3 IMPLANT
COVER PROBE W GEL 5X96 (DRAPES) ×3 IMPLANT
COVER WAND RF STERILE (DRAPES) IMPLANT
DECANTER SPIKE VIAL GLASS SM (MISCELLANEOUS) IMPLANT
DRAPE LAPAROTOMY 100X72 PEDS (DRAPES) ×3 IMPLANT
DRAPE UTILITY XL STRL (DRAPES) ×3 IMPLANT
DRSG TEGADERM 2-3/8X2-3/4 SM (GAUZE/BANDAGES/DRESSINGS) ×2 IMPLANT
DRSG TEGADERM 4X4.75 (GAUZE/BANDAGES/DRESSINGS) ×3 IMPLANT
ELECT REM PT RETURN 9FT ADLT (ELECTROSURGICAL) ×3
ELECTRODE REM PT RTRN 9FT ADLT (ELECTROSURGICAL) ×1 IMPLANT
GAUZE SPONGE 2X2 12PLY NS (GAUZE/BANDAGES/DRESSINGS) ×2 IMPLANT
GAUZE SPONGE 4X4 12PLY STRL (GAUZE/BANDAGES/DRESSINGS) ×2 IMPLANT
GAUZE SPONGE 4X4 12PLY STRL LF (GAUZE/BANDAGES/DRESSINGS) IMPLANT
GLOVE BIO SURGEON STRL SZ7 (GLOVE) ×3 IMPLANT
GLOVE BIOGEL PI IND STRL 7.5 (GLOVE) ×1 IMPLANT
GLOVE BIOGEL PI INDICATOR 7.5 (GLOVE) ×2
GOWN STRL REUS W/ TWL LRG LVL3 (GOWN DISPOSABLE) ×2 IMPLANT
GOWN STRL REUS W/TWL LRG LVL3 (GOWN DISPOSABLE) ×6
ILLUMINATOR WAVEGUIDE N/F (MISCELLANEOUS) IMPLANT
KIT MARKER MARGIN INK (KITS) ×3 IMPLANT
LIGHT WAVEGUIDE WIDE FLAT (MISCELLANEOUS) IMPLANT
NDL HYPO 25X1 1.5 SAFETY (NEEDLE) ×1 IMPLANT
NEEDLE HYPO 25X1 1.5 SAFETY (NEEDLE) ×3 IMPLANT
NS IRRIG 1000ML POUR BTL (IV SOLUTION) ×3 IMPLANT
PACK BASIN DAY SURGERY FS (CUSTOM PROCEDURE TRAY) ×3 IMPLANT
PENCIL SMOKE EVACUATOR (MISCELLANEOUS) ×3 IMPLANT
SLEEVE SCD COMPRESS KNEE MED (MISCELLANEOUS) ×3 IMPLANT
SPONGE GAUZE 2X2 8PLY STER LF (GAUZE/BANDAGES/DRESSINGS)
SPONGE GAUZE 2X2 8PLY STRL LF (GAUZE/BANDAGES/DRESSINGS) IMPLANT
SPONGE LAP 18X18 RF (DISPOSABLE) IMPLANT
SPONGE LAP 4X18 RFD (DISPOSABLE) ×3 IMPLANT
STRIP CLOSURE SKIN 1/2X4 (GAUZE/BANDAGES/DRESSINGS) ×2 IMPLANT
SUT MON AB 4-0 PC3 18 (SUTURE) ×3 IMPLANT
SUT SILK 2 0 SH (SUTURE) IMPLANT
SUT VIC AB 3-0 SH 27 (SUTURE) ×3
SUT VIC AB 3-0 SH 27X BRD (SUTURE) ×1 IMPLANT
SYR BULB 3OZ (MISCELLANEOUS) IMPLANT
SYR CONTROL 10ML LL (SYRINGE) ×3 IMPLANT
TOWEL GREEN STERILE FF (TOWEL DISPOSABLE) ×3 IMPLANT
TRAY FAXITRON CT DISP (TRAY / TRAY PROCEDURE) ×3 IMPLANT
TUBE CONNECTING 20'X1/4 (TUBING)
TUBE CONNECTING 20X1/4 (TUBING) IMPLANT
YANKAUER SUCT BULB TIP NO VENT (SUCTIONS) IMPLANT

## 2019-05-15 NOTE — Anesthesia Procedure Notes (Signed)
Procedure Name: LMA Insertion Date/Time: 05/15/2019 8:34 AM Performed by: [place] Jontae Sonier C, CRNA Pre-anesthesia Checklist: Patient identified, Emergency Drugs available, Suction available and Patient being monitored Patient Re-evaluated:Patient Re-evaluated prior to induction Oxygen Delivery Method: Circle system utilized Preoxygenation: Pre-oxygenation with 100% oxygen Induction Type: IV induction Ventilation: Mask ventilation without difficulty LMA: LMA inserted LMA Size: 4.0 Number of attempts: 1 Airway Equipment and Method: Bite block Placement Confirmation: positive ETCO2 Tube secured with: Tape Dental Injury: Teeth and Oropharynx as per pre-operative assessment

## 2019-05-15 NOTE — H&P (Signed)
History of Present Illness  The patient is a 74 year old female who presents with a breast mass.   This is a 74 year old female in good health who presents after recent finding on a routine screening mammogram. She underwent further workup that showed a 5 mm mass at 3:00 in the right breast. 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 She is referred to [place] for surgical evaluation. The patient had a lumpectomy in 1993 that was reportedly benign. She had another core biopsy on the left side in 1995 but has had no other abnormal findings on mammogram since that time. She has no family history of breast cancer.   CLINICAL DATA: Screening recall for possible right breast mass.  EXAM: DIGITAL DIAGNOSTIC RIGHT MAMMOGRAM WITH CAD AND TOMO  ULTRASOUND RIGHT BREAST  COMPARISON: Previous exam(s).  ACR Breast Density Category b: There are scattered areas of fibroglandular density.  FINDINGS: The mass noted in the medial right breast persists on the diagnostic spot-compression images. It is lobulated, approximately 5 mm in size, mostly circumscribed margins. No associated architectural distortion or calcifications.  Mammographic images were processed with CAD.  Targeted ultrasound is performed, showing a hypoechoic mass in the right breast at 3 o'clock, with mostly circumscribed margins, oval in shape, measuring 5 x 2 x 3 mm, consistent in size, shape and location to the mammographic finding. There are some internal echoes. No documented internal blood flow on color Doppler analysis.  Sonographic evaluation of the right axilla shows no enlarged or abnormal lymph nodes.  IMPRESSION: 1. Indeterminate 5 mm mass in the 3 o'clock position of the right breast. Biopsy is recommended.  RECOMMENDATION: Ultrasound-guided core needle biopsy of the small mass in the medial right breast. This  procedure was scheduled for the next available appointment prior to the patient leaving the breast Center.  I have discussed the findings and recommendations with the patient. If applicable, a reminder letter will be sent to the patient regarding the next appointment.  BI-RADS CATEGORY 4: Suspicious.   Electronically Signed By: Lajean Manes M.D. On: 03/13/2019 14:34  CLINICAL DATA: Biopsy of a 3 o'clock right breast mass  EXAM: ULTRASOUND GUIDED RIGHT BREAST CORE NEEDLE BIOPSY  COMPARISON: Previous exam(s).  FINDINGS: I met with the patient and we discussed the procedure of ultrasound-guided biopsy, including benefits and alternatives. We discussed the high likelihood of a successful procedure. We discussed the risks of the procedure, including infection, bleeding, tissue injury, clip migration, and inadequate sampling. Informed written consent was given. The usual time-out protocol was performed immediately prior to the procedure.  Lesion quadrant: 3 o'clock  Using sterile technique and 1% Lidocaine as local anesthetic, under direct ultrasound visualization, a 12 gauge spring-loaded device was used to perform biopsy of a right breast mass using a medial approach. At the conclusion of the procedure tissue marker clip was deployed into the biopsy cavity. Follow up 2 view mammogram was performed and dictated separately.  IMPRESSION: Ultrasound guided biopsy of a right breast mass. No apparent complications.  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 This was found to be concordant by Dr. Dorise Bullion.  Pathology results were discussed with the patient by  telephone. The patient reported doing well after  the biopsy with tenderness at the site. Post biopsy instructions and care were reviewed and questions were answered. The patient was encouraged to call [facility] for any additional concerns.  Surgical consultation has been arranged with Dr. Donnie Mesa at [facility] on March 29, 2019.  Pathology results reported by Stacie Acres, RN on 03/20/2019.   Electronically Signed By: Dorise Bullion III M.D On: 03/20/2019 15:04    Problem List/Past Medical  BREAST MASS, RIGHT (N63.10)  Past Surgical History  Breast Biopsy Right. Colon Polyp Removal - Colonoscopy Hysterectomy (not due to cancer) - Complete Thyroid Surgery  Diagnostic Studies History  Colonoscopy 1-5 years ago Mammogram 1-3 years ago  Allergies  Aspirin *ANALGESICS - NonNarcotic* Allergies Reconciled  Medication History Simvastatin (20MG Tablet, Oral) Active. [facility] Packet, Oral) Active. Levothyroxine Sodium (150MCG Tablet, Oral) Active. amLODIPine Besylate (5MG Tablet, Oral) Active. Oscal 500/200 D-3 (500-200MG-UNIT Tablet, Oral) Active. Claritin (10MG Tablet, Oral) Active. Medications Reconciled  Social History  Alcohol use Remotely quit alcohol use. No caffeine use No drug use Tobacco use Never smoker.  Family History  Arthritis Mother. Cancer Family Members In General. Heart Disease Mother. Heart disease in female family member before age 73 Hypertension Mother.  Pregnancy / Birth History  Age at menarche 56 years. Age of menopause 79-55 Gravida 0 Para 0  Other Problems  Arthritis Diabetes Mellitus Diverticulosis High blood pressure Lump In Breast     Review of Systems General Not Present- Appetite Loss, Chills, Fatigue, Fever, Night Sweats, Weight Gain and Weight Loss. Skin Not Present- Change in Wart/Mole, Dryness, Hives,  Jaundice, New Lesions, Non-Healing Wounds, Rash and Ulcer. HEENT Present- Seasonal Allergies and Wears glasses/contact lenses. Not Present- Earache, Hearing Loss, Hoarseness, Nose Bleed, Oral Ulcers, Ringing in the Ears, Sinus Pain, Sore Throat, Visual Disturbances and Yellow Eyes. Respiratory Not Present- Bloody sputum, Chronic Cough, Difficulty Breathing, Snoring and Wheezing. Breast Not Present- Breast Mass, Breast Pain, Nipple Discharge and Skin Changes. Cardiovascular Present- Swelling of Extremities. Not Present- Chest Pain, Difficulty Breathing Lying Down, Leg Cramps, Palpitations, Rapid Heart Rate and Shortness of Breath. Gastrointestinal Not Present- Abdominal Pain, Bloating, Bloody Stool, Change in Bowel Habits, Chronic diarrhea, Constipation, Difficulty Swallowing, Excessive gas, Gets full quickly at meals, Hemorrhoids, Indigestion, Nausea, Rectal Pain and Vomiting. Female Genitourinary Not Present- Frequency, Nocturia, Painful Urination, Pelvic Pain and Urgency. Musculoskeletal Not Present- Back Pain, Joint Pain, Joint Stiffness, Muscle Pain, Muscle Weakness and Swelling of Extremities. Neurological Not Present- Decreased Memory, Fainting, Headaches, Numbness, Seizures, Tingling, Tremor, Trouble walking and Weakness. Psychiatric Not Present- Anxiety, Bipolar, Change in Sleep Pattern, Depression, Fearful and Frequent crying. Endocrine Not Present- Cold Intolerance, Excessive Hunger, Hair Changes, Heat Intolerance, Hot flashes and New Diabetes. Hematology Not Present- Blood Thinners, Easy Bruising, Excessive bleeding, Gland problems, HIV and Persistent Infections.  Vitals  Weight: 180.2 lb Height: 63in Body Surface Area: 1.85 m Body Mass Index: 31.92 kg/m  Temp.: 97.68F  Pulse: 85 (Regular)  BP: 158/86 (Sitting, Left Arm, Standard)  Physical Exam   The physical exam findings are as follows: Note:WDWN in NAD Eyes: Pupils equal, round; sclera anicteric HENT: Oral  mucosa moist; good dentition Neck: No masses palpated, no thyromegaly Lungs: CTA bilaterally; normal respiratory effort Breasts: Symmetric, no nipple retraction or discharge, no axillary lymphadenopathy, healing biopsy site medial right breast, no dominant masses on either side. CV: Regular rate and rhythm; no murmurs; extremities well-perfused with no edema Abd: +bowel sounds, soft, non-tender, no palpable organomegaly; no palpable hernias  Skin: Warm, dry; no sign of jaundice Psychiatric - alert and oriented x 4; calm mood and affect    Assessment & Plan   BREAST MASS, RIGHT (N63.10) Impression: Complex sclerosing lesion right breast 64m 3:00  Current Plans Schedule for Surgery - Right radioactive seed localized lumpectomy. The surgical procedure has been discussed with the patient. Potential risks, benefits, alternative treatments, and expected outcomes have been explained. All of the patient's questions at this time have been answered. The likelihood of reaching the patient's treatment goal is good. The patient understand the proposed surgical procedure and wishes to proceed.   MImogene Burn TGeorgette Dover MD, FDelta Memorial HospitalSurgery  General/ Trauma Surgery   05/15/2019 7:11 AM

## 2019-05-15 NOTE — Transfer of Care (Signed)
Immediate Anesthesia Transfer of Care Note  Patient: Loretta York  Procedure(s) Performed: RIGHT BREAST LUMPECTOMY WITH RADIOACTIVE SEED LOCALIZATION (Right Breast)  Patient Location: PACU  Anesthesia Type:General  Level of Consciousness: awake, alert  and oriented  Airway & Oxygen Therapy: Patient Spontanous Breathing and Patient connected to nasal cannula oxygen  Post-op Assessment: Report given to RN and Post -op Vital signs reviewed and stable  Post vital signs: Reviewed and stable  Last Vitals:  Vitals Value Taken Time  BP 147/71 05/15/19 0925  Temp    Pulse 54 05/15/19 0927  Resp 18 05/15/19 0927  SpO2 100 % 05/15/19 0927  Vitals shown include unvalidated device data.  Last Pain:  Vitals:   05/15/19 0737  TempSrc: Oral  PainSc: 0-No pain      Patients Stated Pain Goal: 3 ([id number])  Complications: No apparent anesthesia complications

## 2019-05-15 NOTE — Anesthesia Postprocedure Evaluation (Signed)
Anesthesia Post Note  Patient: Loretta York  Procedure(s) Performed: RIGHT BREAST LUMPECTOMY WITH RADIOACTIVE SEED LOCALIZATION (Right Breast)     Patient location during evaluation: Phase II Anesthesia Type: General Level of consciousness: awake Pain management: pain level controlled Vital Signs Assessment: post-procedure vital signs reviewed and stable Respiratory status: spontaneous breathing Cardiovascular status: stable Postop Assessment: no apparent nausea or vomiting Anesthetic complications: no    Last Vitals:  Vitals:   05/15/19 0935 05/15/19 0945  BP:  (!) 154/74  Pulse: 66 66  Resp: 14 14  Temp:    SpO2: 100% 100%    Last Pain:  Vitals:   05/15/19 0945  TempSrc:   PainSc: 5    Pain Goal: Patients Stated Pain Goal: 5 (05/15/19 0935)                 Huston Foley

## 2019-05-15 NOTE — Discharge Instructions (Signed)
Next Dose of Tylenol at 1:45 PM.   [facility] Office Phone Number [phone]  BREAST BIOPSY/ PARTIAL MASTECTOMY: POST OP INSTRUCTIONS  Always review your discharge instruction sheet given to you by the facility where your surgery was performed.  IF YOU HAVE DISABILITY OR FAMILY LEAVE FORMS, YOU MUST BRING THEM TO THE OFFICE FOR PROCESSING.  DO NOT GIVE THEM TO YOUR DOCTOR.  1. A prescription for pain medication may be given to you upon discharge.  Take your pain medication as prescribed, if needed.  If narcotic pain medicine is not needed, then you may take acetaminophen (Tylenol) or ibuprofen (Advil) as needed. 2. Take your usually prescribed medications unless otherwise directed 3. If you need a refill on your pain medication, please contact your pharmacy.  They will contact our office to request authorization.  Prescriptions will not be filled after 5pm or on week-ends. 4. You should eat very light the first 24 hours after surgery, such as soup, crackers, pudding, etc.  Resume your normal diet the day after surgery. 5. Most patients will experience some swelling and bruising in the breast.  Ice packs and a good support bra will help.  Swelling and bruising can take several days to resolve.  6. It is common to experience some constipation if taking pain medication after surgery.  Increasing fluid intake and taking a stool softener will usually help or prevent this problem from occurring.  A mild laxative (Milk of Magnesia or Miralax) should be taken according to package directions if there are no bowel movements after 48 hours. 7. Unless discharge instructions indicate otherwise, you may remove your bandages 24-48 hours after surgery, and you may shower at that time.  You may have steri-strips (small skin tapes) in place directly over the incision.  These strips should be left on the skin for 7-10 days.  8. ACTIVITIES:  You may resume regular daily activities (gradually  increasing) beginning the next day.  Wearing a good support bra or sports bra minimizes pain and swelling.  You may have sexual intercourse when it is comfortable. a. You may drive when you no longer are taking prescription pain medication, you can comfortably wear a seatbelt, and you can safely maneuver your car and apply brakes. b. RETURN TO WORK:  ______________________________________________________________________________________ 9. You should see your doctor in the office for a follow-up appointment approximately two weeks after your surgery.  Your doctor's nurse will typically make your follow-up appointment when she calls you with your pathology report.  Due to COVID, this post-op visit may be a telephone visit.  Expect your pathology report 2-3 business days after your surgery.  You may call to check if you do not hear from [place] after three days. 10. OTHER INSTRUCTIONS: _______________________________________________________________________________________________ _____________________________________________________________________________________________________________________________________ _____________________________________________________________________________________________________________________________________ _____________________________________________________________________________________________________________________________________  WHEN TO CALL YOUR DOCTOR: 1. Fever over 101.0 2. Nausea and/or vomiting. 3. Extreme swelling or bruising. 4. Continued bleeding from incision. 5. Increased pain, redness, or drainage from the incision.  The clinic staff is available to answer your questions during regular business hours.  Please don't hesitate to call and ask to speak to one of the nurses for clinical concerns.  If you have a medical emergency, go to the nearest emergency room or call 911.  A surgeon from [facility] is always on call at the hospital.  For  further questions, please visit [email or web address]     Post Anesthesia Home Care Instructions  Activity: Get plenty of rest for the remainder of the day.  A responsible individual must stay with you for 24 hours following the procedure.  For the next 24 hours, DO NOT: -Drive a car -Paediatric nurse -Drink alcoholic beverages -Take any medication unless instructed by your physician -Make any legal decisions or sign important papers.  Meals: Start with liquid foods such as gelatin or soup. Progress to regular foods as tolerated. Avoid greasy, spicy, heavy foods. If nausea and/or vomiting occur, drink only clear liquids until the nausea and/or vomiting subsides. Call your physician if vomiting continues.  Special Instructions/Symptoms: Your throat may feel dry or sore from the anesthesia or the breathing tube placed in your throat during surgery. If this causes discomfort, gargle with warm salt water. The discomfort should disappear within 24 hours.  If you had a scopolamine patch placed behind your ear for the management of post- operative nausea and/or vomiting:  1. The medication in the patch is effective for 72 hours, after which it should be removed.  Wrap patch in a tissue and discard in the trash. Wash hands thoroughly with soap and water. 2. You may remove the patch earlier than 72 hours if you experience unpleasant side effects which may include dry mouth, dizziness or visual disturbances. 3. Avoid touching the patch. Wash your hands with soap and water after contact with the patch.

## 2019-05-15 NOTE — Op Note (Signed)
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 Anesthesia:  GEN - LMA Indications:  This is a 74 year old female in good health who presents after recent finding on a routine screening mammogram. She underwent further workup that showed a 5 mm mass at 3:00 in the right breast. 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 She is referred to [place] for surgical evaluation. The patient had a lumpectomy in 1993 that was reportedly benign. She had another core biopsy on the left side in 1995 but has had no other abnormal findings on mammogram since that time. She has no family history of breast cancer.  Description of procedure: The patient is brought to the operating room placed in supine position on the operating room table. After an adequate level of general anesthesia was obtained, her right breast was prepped with ChloraPrep and draped in sterile fashion. A timeout was taken to ensure the proper patient and proper procedure. We interrogated the breast with the neoprobe. We made a circumareolar incision around the medial side of the nipple after infiltrating with 0.25% Marcaine. Dissection was carried down in the breast tissue with cautery. We used the neoprobe to guide [place] towards the radioactive seed at 3:00. We excised an area of tissue around the radioactive seed 2 cm in diameter. The specimen was removed and was oriented with a paint kit. The seed was dislodged during removal, but was located and removed separately.  Specimen mammogram showed the radioactive seed in a separate cup as well as the biopsy clip within the specimen. This was sent for pathologic examination. There is no residual radioactivity within the biopsy cavity. We inspected carefully for hemostasis. The wound was  thoroughly irrigated. The wound was closed with a deep layer of 3-0 Vicryl and a subcuticular layer of 4-0 Monocryl. Benzoin Steri-Strips were applied. The patient was then extubated and brought to the recovery room in stable condition. All sponge, instrument, and needle counts are correct.  Imogene Burn. Georgette Dover, MD, [facility]  General/ Trauma Surgery  05/15/2019 9:21 AM

## 2019-05-16 ENCOUNTER — Encounter: Payer: Self-pay | Admitting: *Deleted

## 2019-05-16 LAB — SURGICAL PATHOLOGY

## 2019-05-16 IMAGING — MG DIGITAL SCREENING BILATERAL MAMMOGRAM WITH CAD
4 series · 4 of 4 positions shown · non-contrast
Comparison: Previous exam(s).

CLINICAL DATA: Screening.

EXAM:
DIGITAL SCREENING BILATERAL MAMMOGRAM WITH CAD

[R MLO]
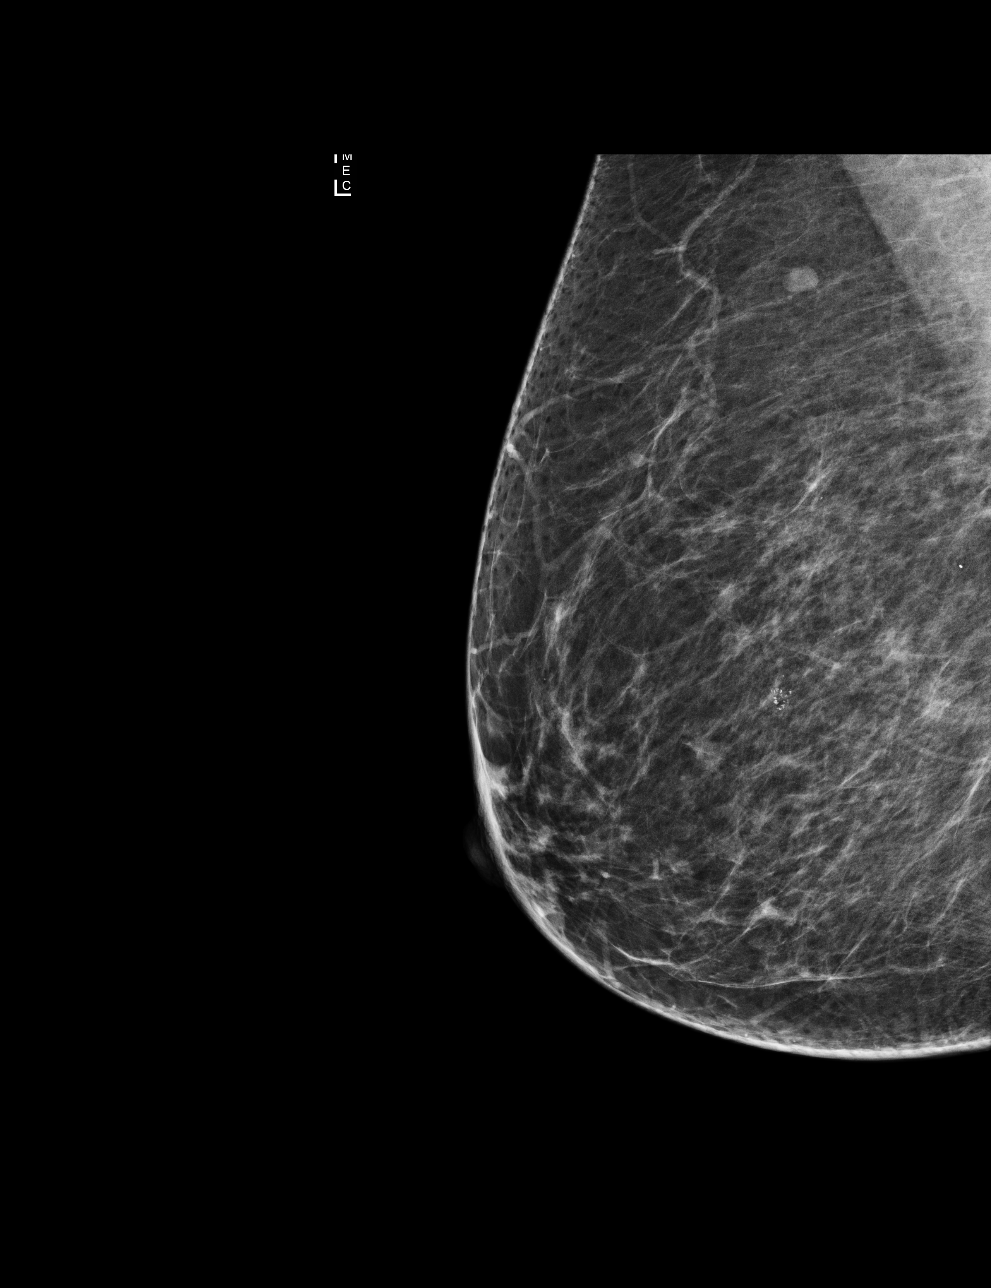

[R CC]
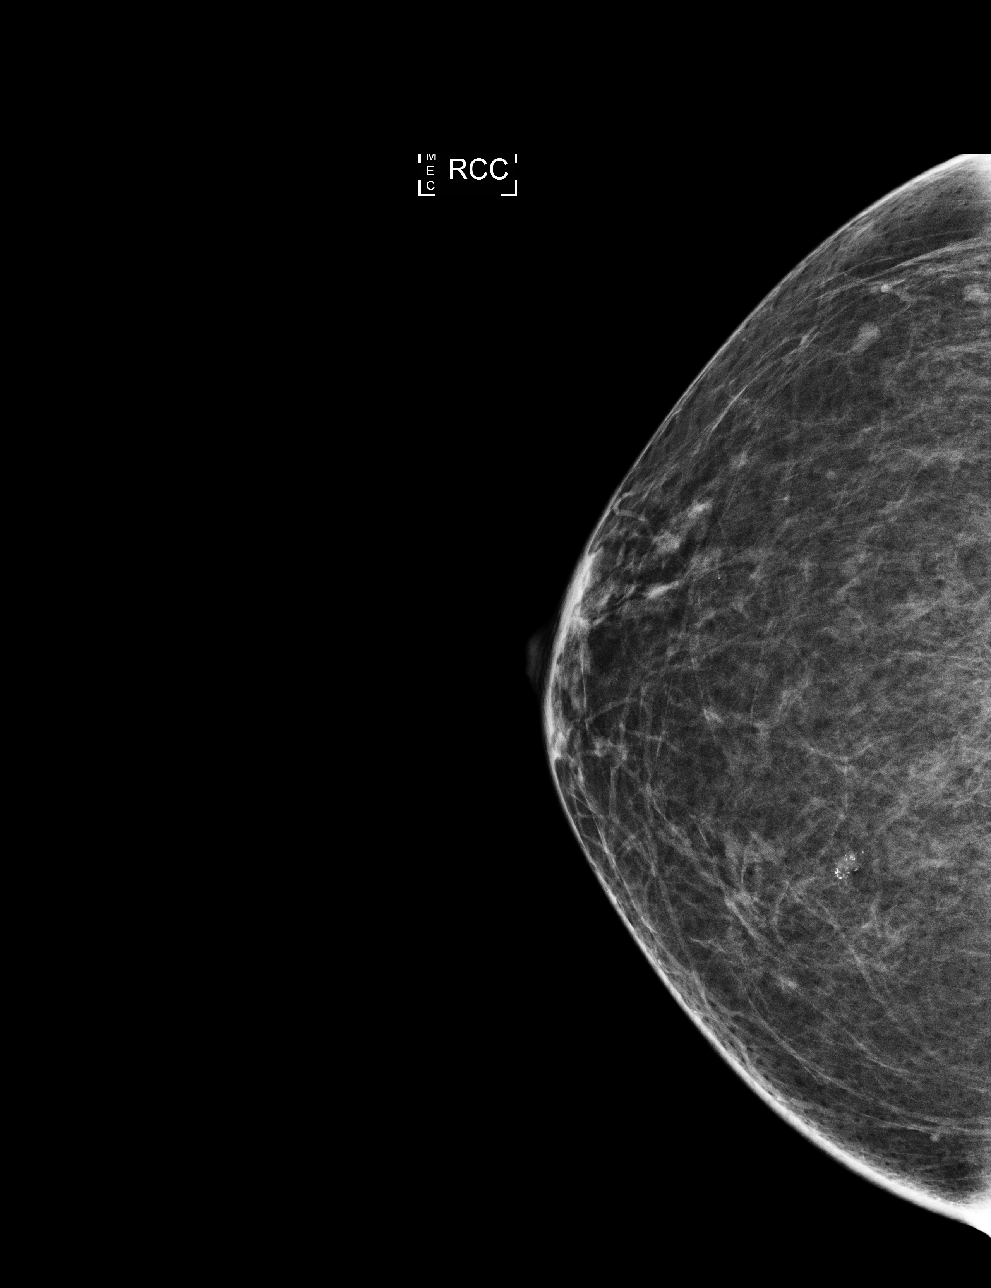

[L CC]
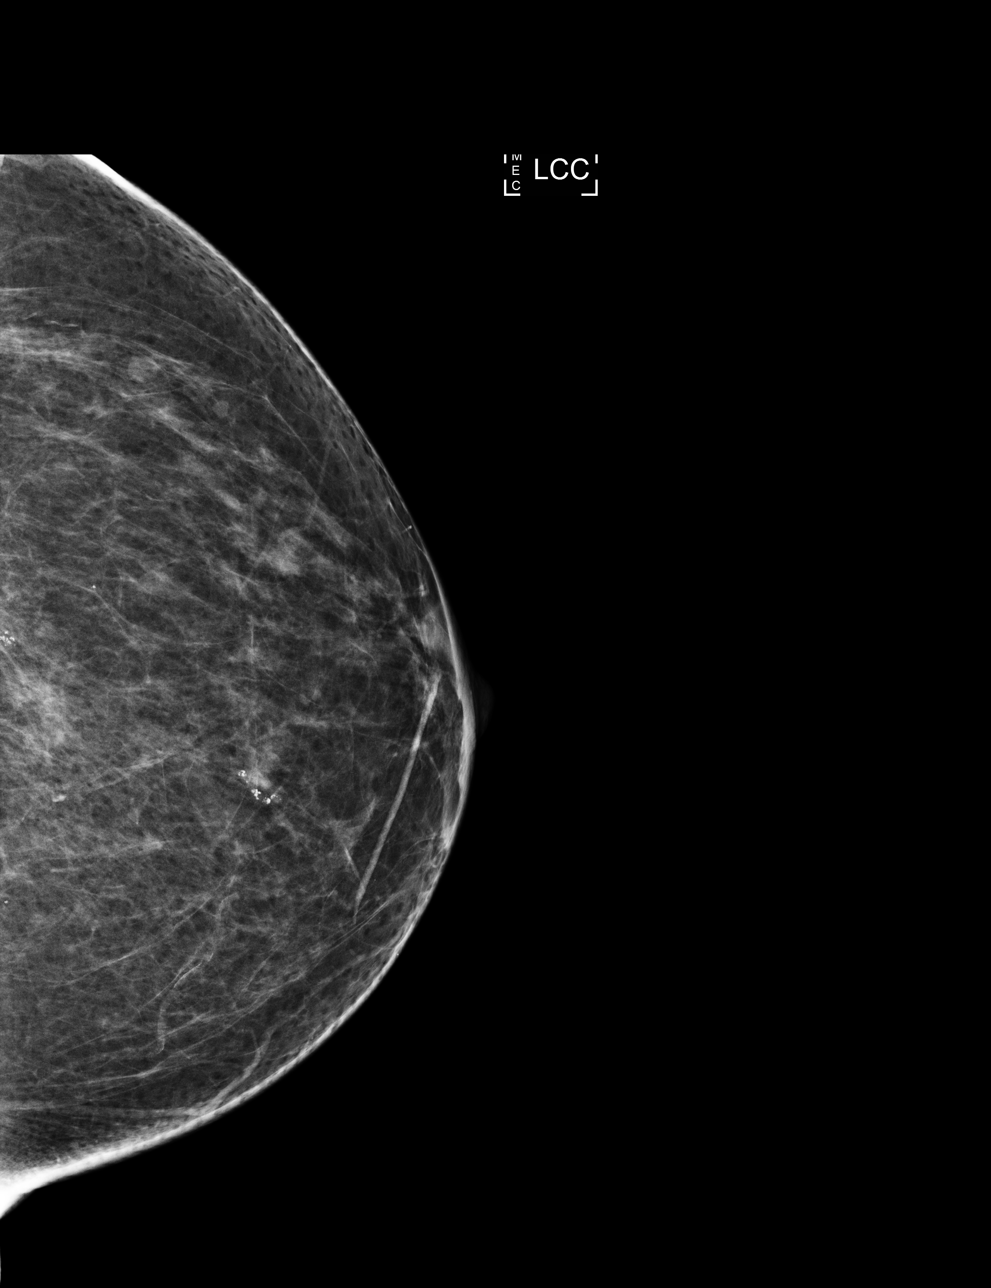

[L MLO]
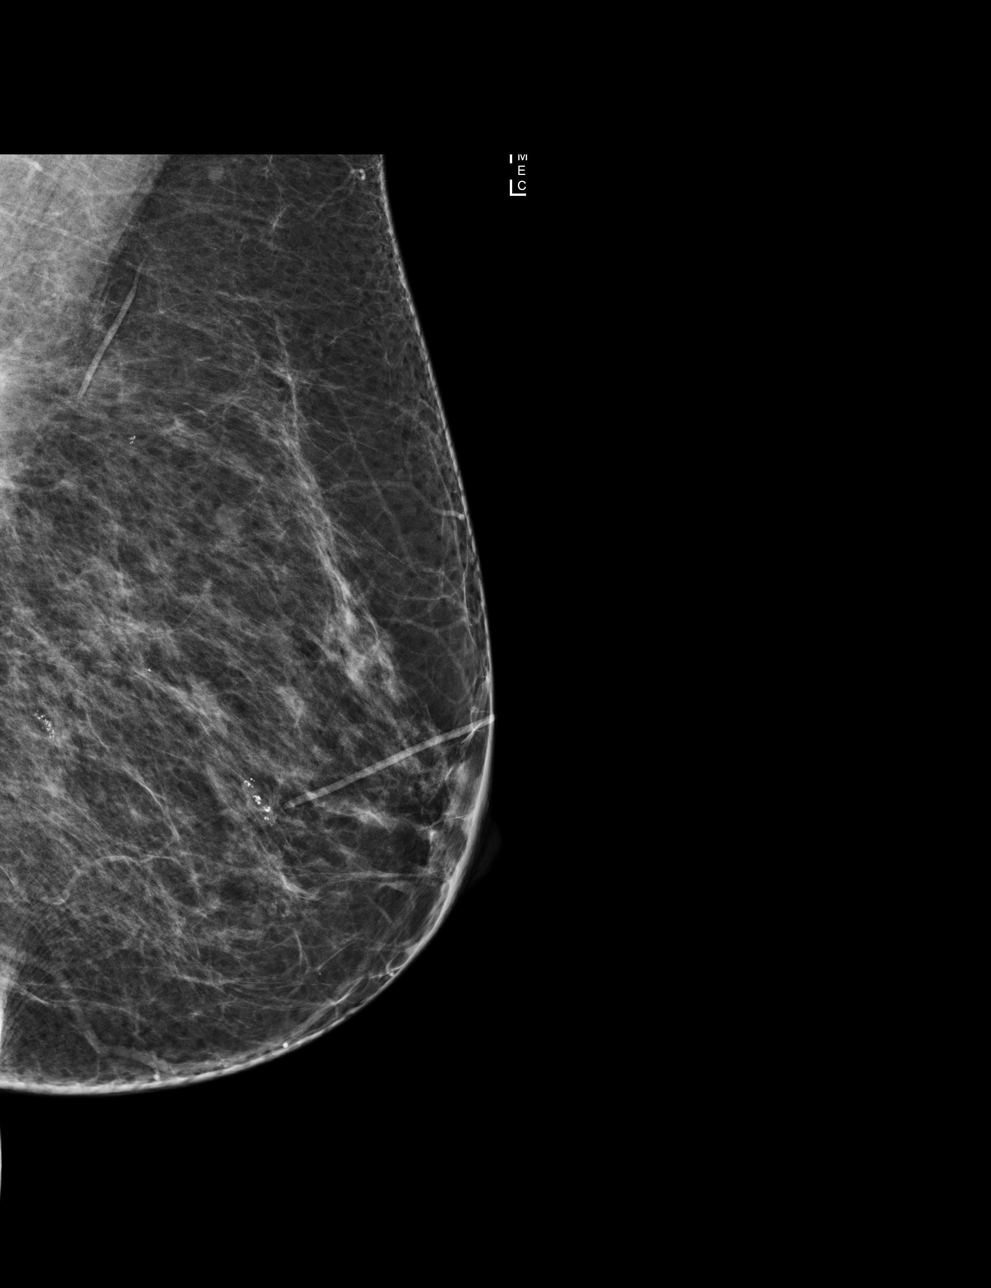

[4 of 4 positions shown; findings below may reference images not displayed]

ACR Breast Density Category b: There are scattered areas of
fibroglandular density.
FINDINGS: There are no findings suspicious for malignancy. Images were
processed with CAD.
IMPRESSION: No mammographic evidence of malignancy. A result letter of this
screening mammogram will be mailed directly to the patient.

RECOMMENDATION:
Screening mammogram in one year. (Code:AS-G-LCT)

BI-RADS CATEGORY  1: Negative.

## 2019-05-29 ENCOUNTER — Ambulatory Visit: Payer: Medicare Other | Attending: Internal Medicine

## 2019-05-29 DIAGNOSIS — Z23 Encounter for immunization: Secondary | ICD-10-CM | POA: Insufficient documentation

## 2019-05-29 NOTE — Progress Notes (Signed)
   Covid-19 Vaccination Clinic  Name:  PAETYN WESTERMANN    MRN: [id number] DOB: 23-Oct-1945  05/29/2019  Ms. Heathcote was observed post Covid-19 immunization for 15 minutes without incidence. She was provided with Vaccine Information Sheet and instruction to access the V-Safe system.   Ms. Hosek was instructed to call 911 with any severe reactions post vaccine: Marland Kitchen Difficulty breathing  . Swelling of your face and throat  . A fast heartbeat  . A bad rash all over your body  . Dizziness and weakness    Immunizations Administered    Name Date Dose VIS Date Route   Pfizer COVID-19 Vaccine 05/29/2019 10:41 AM 0.3 mL 04/19/2019 Intramuscular   Manufacturer: [place]   Lot: [id number]   [place]: [id number]

## 2019-06-17 ENCOUNTER — Ambulatory Visit: Payer: Medicare Other | Attending: Internal Medicine

## 2019-06-17 DIAGNOSIS — Z23 Encounter for immunization: Secondary | ICD-10-CM | POA: Insufficient documentation

## 2019-06-17 NOTE — Progress Notes (Signed)
   Covid-19 Vaccination Clinic  Name:  Loretta York    MRN: [id number] DOB: Feb 15, 1946  06/17/2019  [name] was observed post Covid-19 immunization for 15 minutes without incidence. She was provided with Vaccine Information Sheet and instruction to access the V-Safe system.   [name] was instructed to call 911 with any severe reactions post vaccine: Marland Kitchen Difficulty breathing  . Swelling of your face and throat  . A fast heartbeat  . A bad rash all over your body  . Dizziness and weakness    Immunizations Administered    Name Date Dose VIS Date Route   Pfizer COVID-19 Vaccine 06/17/2019  4:11 PM 0.3 mL 04/19/2019 Intramuscular   Manufacturer: [place]   Lot: [id number]   [name]: [id number]

## 2020-01-30 ENCOUNTER — Other Ambulatory Visit: Payer: Self-pay | Admitting: Family Medicine

## 2020-01-30 DIAGNOSIS — Z1231 Encounter for screening mammogram for malignant neoplasm of breast: Secondary | ICD-10-CM

## 2020-03-06 ENCOUNTER — Ambulatory Visit: Payer: Medicare Other

## 2020-04-13 ENCOUNTER — Other Ambulatory Visit: Payer: Self-pay

## 2020-04-13 ENCOUNTER — Other Ambulatory Visit: Payer: Self-pay | Admitting: Family Medicine

## 2020-04-13 ENCOUNTER — Ambulatory Visit
Admission: RE | Admit: 2020-04-13 | Discharge: 2020-04-13 | Disposition: A | Discharge status: Home / Self Care | Payer: Medicare Other | Source: Ambulatory Visit | Attending: Family Medicine | Admitting: Family Medicine

## 2020-04-13 DIAGNOSIS — Z1231 Encounter for screening mammogram for malignant neoplasm of breast: Secondary | ICD-10-CM

## 2020-05-29 ENCOUNTER — Other Ambulatory Visit: Payer: Self-pay | Admitting: Family Medicine

## 2020-05-29 DIAGNOSIS — E039 Hypothyroidism, unspecified: Secondary | ICD-10-CM | POA: Diagnosis not present

## 2020-05-29 DIAGNOSIS — Z23 Encounter for immunization: Secondary | ICD-10-CM | POA: Diagnosis not present

## 2020-05-29 DIAGNOSIS — E7800 Pure hypercholesterolemia, unspecified: Secondary | ICD-10-CM | POA: Diagnosis not present

## 2020-05-29 DIAGNOSIS — R7303 Prediabetes: Secondary | ICD-10-CM | POA: Diagnosis not present

## 2020-05-29 DIAGNOSIS — M8580 Other specified disorders of bone density and structure, unspecified site: Secondary | ICD-10-CM

## 2020-05-29 DIAGNOSIS — I129 Hypertensive chronic kidney disease with stage 1 through stage 4 chronic kidney disease, or unspecified chronic kidney disease: Secondary | ICD-10-CM | POA: Diagnosis not present

## 2020-06-10 DIAGNOSIS — H2513 Age-related nuclear cataract, bilateral: Secondary | ICD-10-CM | POA: Diagnosis not present

## 2020-06-10 DIAGNOSIS — E119 Type 2 diabetes mellitus without complications: Secondary | ICD-10-CM | POA: Diagnosis not present

## 2020-09-02 DIAGNOSIS — M25561 Pain in right knee: Secondary | ICD-10-CM | POA: Diagnosis not present

## 2020-09-30 ENCOUNTER — Ambulatory Visit
Admission: RE | Admit: 2020-09-30 | Discharge: 2020-09-30 | Disposition: A | Discharge status: Home / Self Care | Payer: Medicare Other | Source: Ambulatory Visit | Attending: Family Medicine | Admitting: Family Medicine

## 2020-09-30 ENCOUNTER — Other Ambulatory Visit: Payer: Self-pay

## 2020-09-30 DIAGNOSIS — M85851 Other specified disorders of bone density and structure, right thigh: Secondary | ICD-10-CM | POA: Diagnosis not present

## 2020-09-30 DIAGNOSIS — M8580 Other specified disorders of bone density and structure, unspecified site: Secondary | ICD-10-CM

## 2020-09-30 DIAGNOSIS — Z780 Asymptomatic menopausal state: Secondary | ICD-10-CM | POA: Diagnosis not present

## 2020-09-30 DIAGNOSIS — M1711 Unilateral primary osteoarthritis, right knee: Secondary | ICD-10-CM | POA: Diagnosis not present

## 2020-10-07 DIAGNOSIS — M25561 Pain in right knee: Secondary | ICD-10-CM | POA: Diagnosis not present

## 2020-11-05 DIAGNOSIS — M25561 Pain in right knee: Secondary | ICD-10-CM | POA: Diagnosis not present

## 2020-11-30 DIAGNOSIS — Z1389 Encounter for screening for other disorder: Secondary | ICD-10-CM | POA: Diagnosis not present

## 2020-11-30 DIAGNOSIS — R7303 Prediabetes: Secondary | ICD-10-CM | POA: Diagnosis not present

## 2020-11-30 DIAGNOSIS — E039 Hypothyroidism, unspecified: Secondary | ICD-10-CM | POA: Diagnosis not present

## 2020-11-30 DIAGNOSIS — D241 Benign neoplasm of right breast: Secondary | ICD-10-CM | POA: Diagnosis not present

## 2020-11-30 DIAGNOSIS — J309 Allergic rhinitis, unspecified: Secondary | ICD-10-CM | POA: Diagnosis not present

## 2020-11-30 DIAGNOSIS — I129 Hypertensive chronic kidney disease with stage 1 through stage 4 chronic kidney disease, or unspecified chronic kidney disease: Secondary | ICD-10-CM | POA: Diagnosis not present

## 2020-11-30 DIAGNOSIS — Z23 Encounter for immunization: Secondary | ICD-10-CM | POA: Diagnosis not present

## 2020-11-30 DIAGNOSIS — E7800 Pure hypercholesterolemia, unspecified: Secondary | ICD-10-CM | POA: Diagnosis not present

## 2020-11-30 DIAGNOSIS — M810 Age-related osteoporosis without current pathological fracture: Secondary | ICD-10-CM | POA: Diagnosis not present

## 2020-11-30 DIAGNOSIS — Z0000 Encounter for general adult medical examination without abnormal findings: Secondary | ICD-10-CM | POA: Diagnosis not present

## 2020-12-14 DIAGNOSIS — M65321 Trigger finger, right index finger: Secondary | ICD-10-CM | POA: Diagnosis not present

## 2021-01-23 DIAGNOSIS — Z23 Encounter for immunization: Secondary | ICD-10-CM | POA: Diagnosis not present

## 2021-02-01 DIAGNOSIS — E039 Hypothyroidism, unspecified: Secondary | ICD-10-CM | POA: Diagnosis not present

## 2021-03-04 ENCOUNTER — Other Ambulatory Visit: Payer: Self-pay | Admitting: Family Medicine

## 2021-03-04 DIAGNOSIS — Z1231 Encounter for screening mammogram for malignant neoplasm of breast: Secondary | ICD-10-CM

## 2021-04-14 ENCOUNTER — Ambulatory Visit
Admission: RE | Admit: 2021-04-14 | Discharge: 2021-04-14 | Disposition: A | Discharge status: Home / Self Care | Payer: Medicare Other | Source: Ambulatory Visit | Attending: Family Medicine | Admitting: Family Medicine

## 2021-04-14 DIAGNOSIS — Z1231 Encounter for screening mammogram for malignant neoplasm of breast: Secondary | ICD-10-CM

## 2021-06-11 DIAGNOSIS — E119 Type 2 diabetes mellitus without complications: Secondary | ICD-10-CM | POA: Diagnosis not present

## 2021-06-11 DIAGNOSIS — H2513 Age-related nuclear cataract, bilateral: Secondary | ICD-10-CM | POA: Diagnosis not present

## 2021-06-18 DIAGNOSIS — E039 Hypothyroidism, unspecified: Secondary | ICD-10-CM | POA: Diagnosis not present

## 2021-06-18 DIAGNOSIS — R7303 Prediabetes: Secondary | ICD-10-CM | POA: Diagnosis not present

## 2021-06-18 DIAGNOSIS — E7800 Pure hypercholesterolemia, unspecified: Secondary | ICD-10-CM | POA: Diagnosis not present

## 2021-06-18 DIAGNOSIS — N183 Chronic kidney disease, stage 3 unspecified: Secondary | ICD-10-CM | POA: Diagnosis not present

## 2021-06-18 DIAGNOSIS — I129 Hypertensive chronic kidney disease with stage 1 through stage 4 chronic kidney disease, or unspecified chronic kidney disease: Secondary | ICD-10-CM | POA: Diagnosis not present

## 2021-09-14 DIAGNOSIS — M65321 Trigger finger, right index finger: Secondary | ICD-10-CM | POA: Diagnosis not present

## 2021-10-19 DIAGNOSIS — S90122A Contusion of left lesser toe(s) without damage to nail, initial encounter: Secondary | ICD-10-CM | POA: Diagnosis not present

## 2021-11-04 DIAGNOSIS — S90122D Contusion of left lesser toe(s) without damage to nail, subsequent encounter: Secondary | ICD-10-CM | POA: Diagnosis not present

## 2021-11-22 DIAGNOSIS — R1909 Other intra-abdominal and pelvic swelling, mass and lump: Secondary | ICD-10-CM | POA: Diagnosis not present

## 2021-12-08 DIAGNOSIS — Z1211 Encounter for screening for malignant neoplasm of colon: Secondary | ICD-10-CM | POA: Diagnosis not present

## 2021-12-08 DIAGNOSIS — M8588 Other specified disorders of bone density and structure, other site: Secondary | ICD-10-CM | POA: Diagnosis not present

## 2021-12-08 DIAGNOSIS — Z0000 Encounter for general adult medical examination without abnormal findings: Secondary | ICD-10-CM | POA: Diagnosis not present

## 2021-12-08 DIAGNOSIS — E039 Hypothyroidism, unspecified: Secondary | ICD-10-CM | POA: Diagnosis not present

## 2021-12-08 DIAGNOSIS — E7800 Pure hypercholesterolemia, unspecified: Secondary | ICD-10-CM | POA: Diagnosis not present

## 2021-12-08 DIAGNOSIS — R7303 Prediabetes: Secondary | ICD-10-CM | POA: Diagnosis not present

## 2021-12-08 DIAGNOSIS — Z1239 Encounter for other screening for malignant neoplasm of breast: Secondary | ICD-10-CM | POA: Diagnosis not present

## 2021-12-08 DIAGNOSIS — I129 Hypertensive chronic kidney disease with stage 1 through stage 4 chronic kidney disease, or unspecified chronic kidney disease: Secondary | ICD-10-CM | POA: Diagnosis not present

## 2022-02-05 DIAGNOSIS — Z23 Encounter for immunization: Secondary | ICD-10-CM | POA: Diagnosis not present

## 2022-03-04 ENCOUNTER — Other Ambulatory Visit: Payer: Self-pay | Admitting: Family Medicine

## 2022-03-04 DIAGNOSIS — Z1231 Encounter for screening mammogram for malignant neoplasm of breast: Secondary | ICD-10-CM

## 2022-03-24 DIAGNOSIS — Z86010 Personal history of colonic polyps: Secondary | ICD-10-CM | POA: Diagnosis not present

## 2022-04-13 DIAGNOSIS — K5730 Diverticulosis of large intestine without perforation or abscess without bleeding: Secondary | ICD-10-CM | POA: Diagnosis not present

## 2022-04-13 DIAGNOSIS — Z86010 Personal history of colonic polyps: Secondary | ICD-10-CM | POA: Diagnosis not present

## 2022-04-13 DIAGNOSIS — Z09 Encounter for follow-up examination after completed treatment for conditions other than malignant neoplasm: Secondary | ICD-10-CM | POA: Diagnosis not present

## 2022-04-13 DIAGNOSIS — K648 Other hemorrhoids: Secondary | ICD-10-CM | POA: Diagnosis not present

## 2022-04-13 DIAGNOSIS — K644 Residual hemorrhoidal skin tags: Secondary | ICD-10-CM | POA: Diagnosis not present

## 2022-04-13 DIAGNOSIS — D122 Benign neoplasm of ascending colon: Secondary | ICD-10-CM | POA: Diagnosis not present

## 2022-04-29 ENCOUNTER — Ambulatory Visit
Admission: RE | Admit: 2022-04-29 | Discharge: 2022-04-29 | Disposition: A | Discharge status: Home / Self Care | Payer: Medicare Other | Source: Ambulatory Visit | Attending: Family Medicine | Admitting: Family Medicine

## 2022-04-29 DIAGNOSIS — Z1231 Encounter for screening mammogram for malignant neoplasm of breast: Secondary | ICD-10-CM | POA: Diagnosis not present

## 2022-06-10 DIAGNOSIS — M8588 Other specified disorders of bone density and structure, other site: Secondary | ICD-10-CM | POA: Diagnosis not present

## 2022-06-10 DIAGNOSIS — N183 Chronic kidney disease, stage 3 unspecified: Secondary | ICD-10-CM | POA: Diagnosis not present

## 2022-06-10 DIAGNOSIS — E7800 Pure hypercholesterolemia, unspecified: Secondary | ICD-10-CM | POA: Diagnosis not present

## 2022-06-10 DIAGNOSIS — I129 Hypertensive chronic kidney disease with stage 1 through stage 4 chronic kidney disease, or unspecified chronic kidney disease: Secondary | ICD-10-CM | POA: Diagnosis not present

## 2022-06-10 DIAGNOSIS — R7303 Prediabetes: Secondary | ICD-10-CM | POA: Diagnosis not present

## 2022-06-10 DIAGNOSIS — E039 Hypothyroidism, unspecified: Secondary | ICD-10-CM | POA: Diagnosis not present

## 2022-06-15 DIAGNOSIS — H25813 Combined forms of age-related cataract, bilateral: Secondary | ICD-10-CM | POA: Diagnosis not present

## 2022-06-15 DIAGNOSIS — E119 Type 2 diabetes mellitus without complications: Secondary | ICD-10-CM | POA: Diagnosis not present

## 2022-06-15 DIAGNOSIS — H52223 Regular astigmatism, bilateral: Secondary | ICD-10-CM | POA: Diagnosis not present

## 2022-06-15 DIAGNOSIS — H524 Presbyopia: Secondary | ICD-10-CM | POA: Diagnosis not present

## 2022-07-05 IMAGING — MG DIGITAL SCREENING BILAT W/ TOMO W/ CAD
8 series · 8 of 24 positions shown · non-contrast
Comparison: Previous exam(s).

CLINICAL DATA: Screening. Interval right breast excision for
papilloma/complex sclerosing lesion.

EXAM:
DIGITAL SCREENING BILATERAL MAMMOGRAM WITH TOMO AND CAD

[R MLO synth-2D]
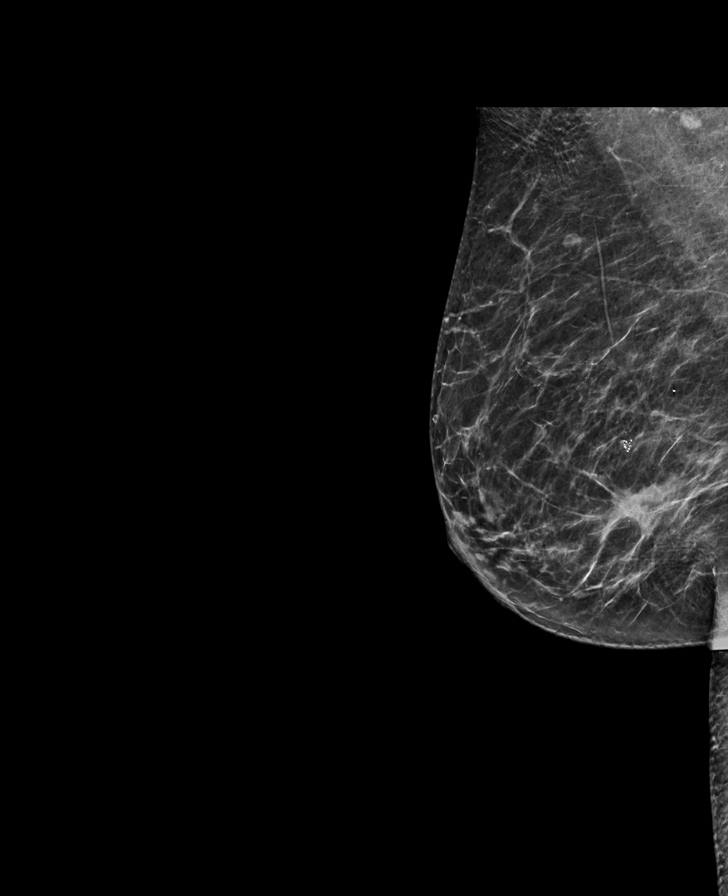

[L MLO synth-2D]
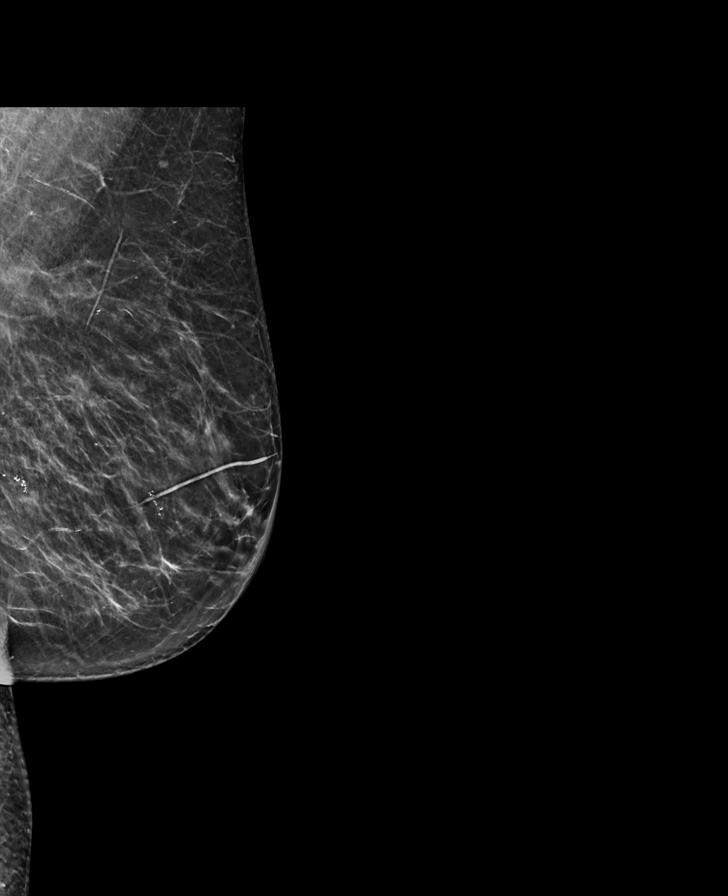

[R CC synth-2D]
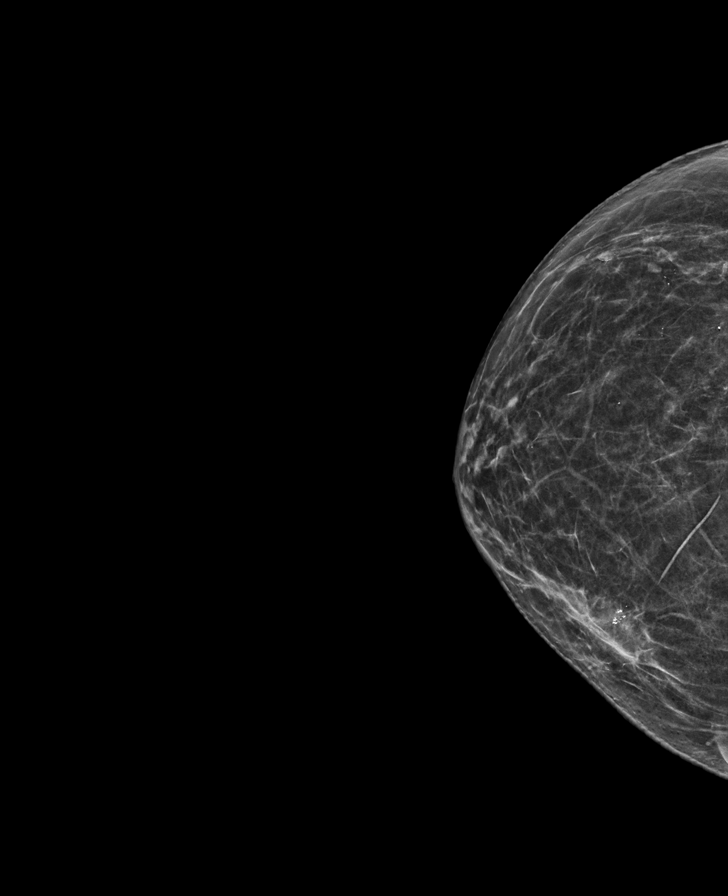

[L CC synth-2D]
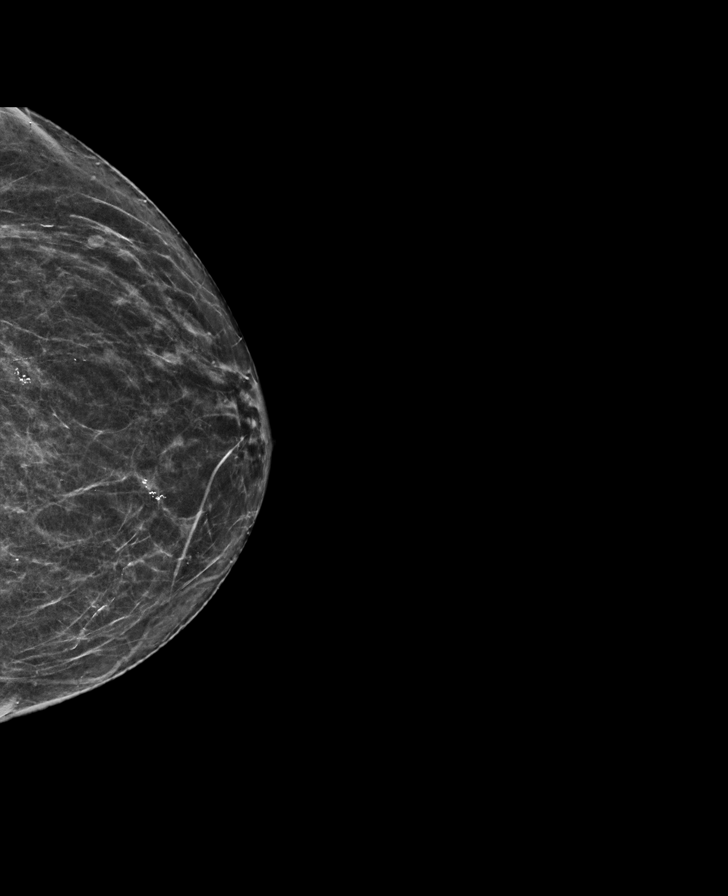

[R MLO tomo · tomo slice 33/65.0]
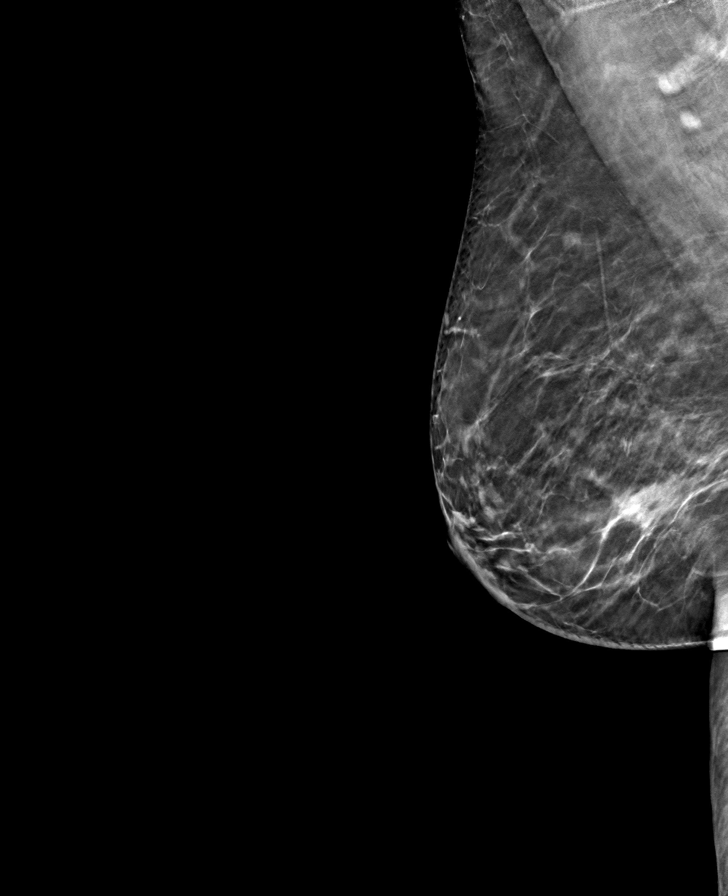

[R CC tomo · tomo slice 29/57.0]
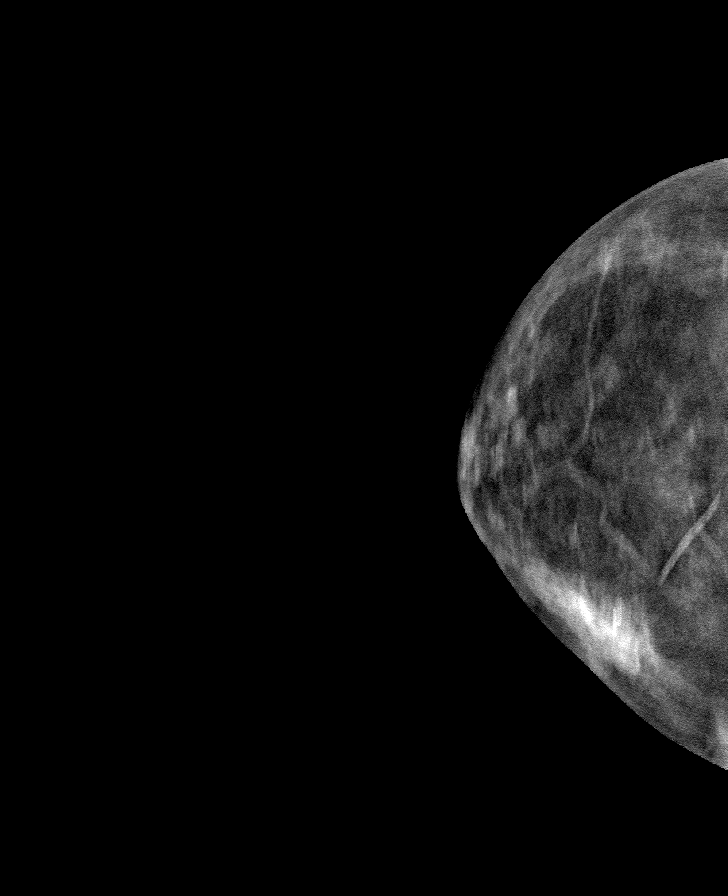

[L CC tomo · tomo slice 31/62.0]
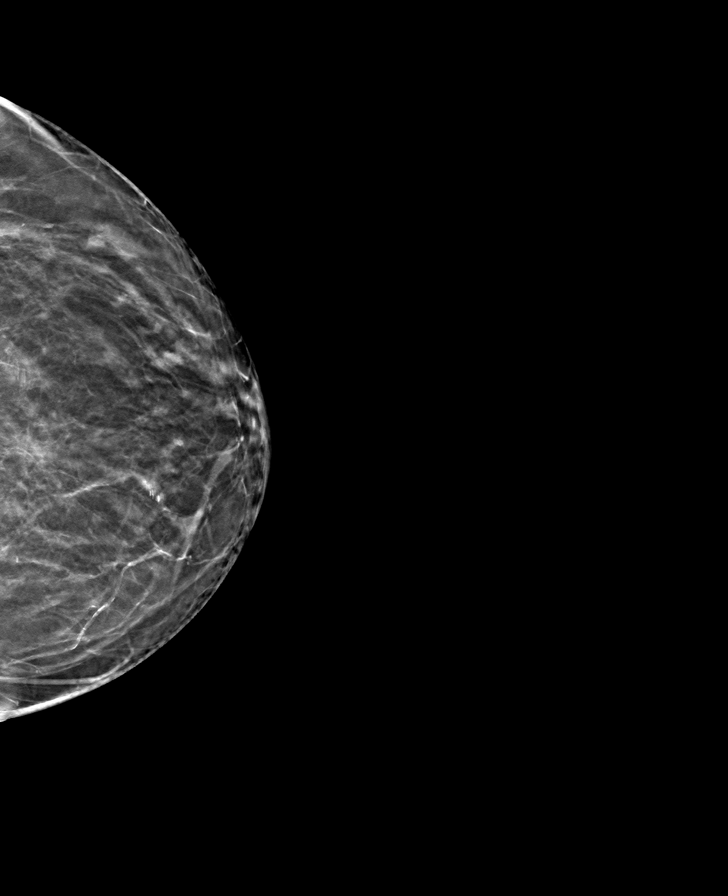

[L MLO tomo · tomo slice 34/67.0]
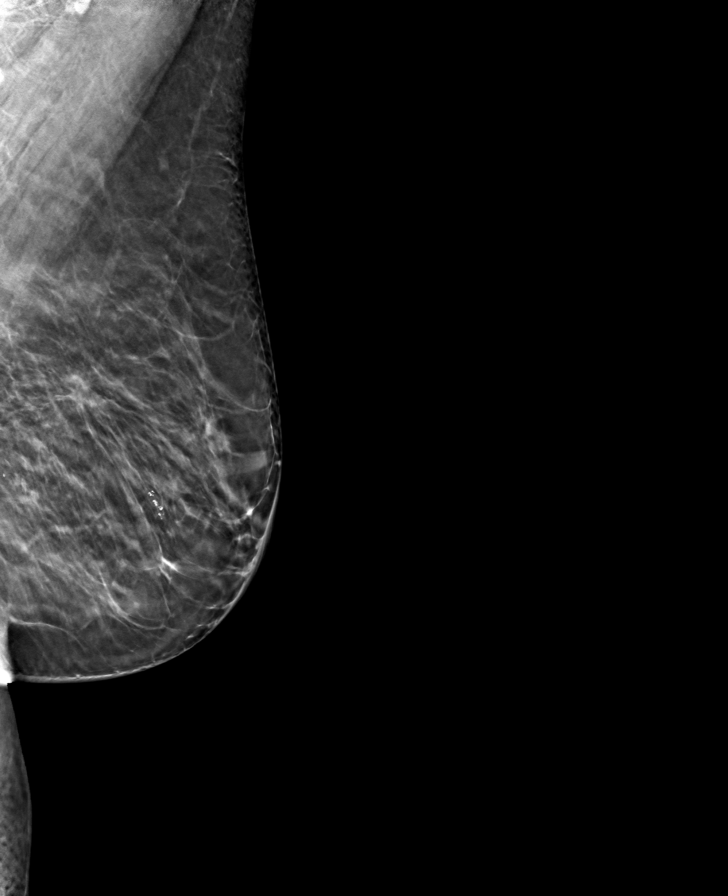

[8 of 24 positions shown; findings below may reference images not displayed]

ACR Breast Density Category b: There are scattered areas of
fibroglandular density.
FINDINGS: There are no findings suspicious for malignancy. New postsurgical
changes are present in the lower inner right breast. Images were
processed with CAD.
IMPRESSION: No mammographic evidence of malignancy. A result letter of this
screening mammogram will be mailed directly to the patient.

RECOMMENDATION:
Screening mammogram in one year. (Code:G3-8-2ZZ)

BI-RADS CATEGORY  1: Negative.

## 2022-09-14 DIAGNOSIS — R7303 Prediabetes: Secondary | ICD-10-CM | POA: Diagnosis not present

## 2022-09-14 DIAGNOSIS — E7800 Pure hypercholesterolemia, unspecified: Secondary | ICD-10-CM | POA: Diagnosis not present

## 2022-09-14 DIAGNOSIS — I129 Hypertensive chronic kidney disease with stage 1 through stage 4 chronic kidney disease, or unspecified chronic kidney disease: Secondary | ICD-10-CM | POA: Diagnosis not present

## 2022-09-14 DIAGNOSIS — N183 Chronic kidney disease, stage 3 unspecified: Secondary | ICD-10-CM | POA: Diagnosis not present

## 2022-09-14 DIAGNOSIS — E039 Hypothyroidism, unspecified: Secondary | ICD-10-CM | POA: Diagnosis not present

## 2022-09-14 DIAGNOSIS — R42 Dizziness and giddiness: Secondary | ICD-10-CM | POA: Diagnosis not present

## 2022-09-29 DIAGNOSIS — N171 Acute kidney failure with acute cortical necrosis: Secondary | ICD-10-CM | POA: Diagnosis not present

## 2022-10-18 DIAGNOSIS — I129 Hypertensive chronic kidney disease with stage 1 through stage 4 chronic kidney disease, or unspecified chronic kidney disease: Secondary | ICD-10-CM | POA: Diagnosis not present

## 2022-10-18 DIAGNOSIS — R42 Dizziness and giddiness: Secondary | ICD-10-CM | POA: Diagnosis not present

## 2022-10-18 DIAGNOSIS — E039 Hypothyroidism, unspecified: Secondary | ICD-10-CM | POA: Diagnosis not present

## 2022-11-01 ENCOUNTER — Other Ambulatory Visit: Payer: Self-pay | Admitting: Family Medicine

## 2022-11-01 DIAGNOSIS — R42 Dizziness and giddiness: Secondary | ICD-10-CM

## 2022-11-07 ENCOUNTER — Ambulatory Visit
Admission: RE | Admit: 2022-11-07 | Discharge: 2022-11-07 | Disposition: A | Discharge status: Home / Self Care | Payer: Medicare Other | Source: Ambulatory Visit | Attending: Family Medicine | Admitting: Family Medicine

## 2022-11-07 DIAGNOSIS — R42 Dizziness and giddiness: Secondary | ICD-10-CM

## 2022-12-01 ENCOUNTER — Encounter: Payer: Self-pay | Admitting: Sports Medicine

## 2022-12-01 ENCOUNTER — Ambulatory Visit: Payer: Medicare Other | Admitting: Sports Medicine

## 2022-12-01 ENCOUNTER — Other Ambulatory Visit (INDEPENDENT_AMBULATORY_CARE_PROVIDER_SITE_OTHER): Payer: Medicare Other

## 2022-12-01 DIAGNOSIS — M65321 Trigger finger, right index finger: Secondary | ICD-10-CM

## 2022-12-01 DIAGNOSIS — M1811 Unilateral primary osteoarthritis of first carpometacarpal joint, right hand: Secondary | ICD-10-CM | POA: Diagnosis not present

## 2022-12-01 DIAGNOSIS — M79641 Pain in right hand: Secondary | ICD-10-CM

## 2022-12-01 NOTE — Progress Notes (Signed)
Right hand/thumb pain She stated her thumb was swelling; but has since subsided since making appointment She also has a trigger finger same hand; index States she has had this problem before where she has had 2 cortisone injections  Tylenol prn for pain

## 2022-12-01 NOTE — Progress Notes (Signed)
Loretta York - 77 y.o. female MRN [id number]  Date of birth: 02/06/1946  Office Visit Note: Visit Date: 12/01/2022 PCP: Maurice Small, MD (Inactive) Referred by: No ref. provider found  Subjective: Chief Complaint  Patient presents with   Right Hand - Pain   HPI: Loretta York is a pleasant 77 y.o. female who presents today for chronic right hand and thumb pain.  Had a few days of right thumb pain with some swelling.  Tried topical Voltaren gel and Biofreeze without much relief.  Also used oral Tylenol.  Over the last few days since making the appointment her pain has largely resolved but still wanted to be seen for this.   She has a history of right index trigger finger.  She has had corticosteroid injections in the past with good relief, last was about 1.5 years ago.  Currently not having any triggering episodes.  Pertinent ROS were reviewed with the patient and found to be negative unless otherwise specified above in HPI.   Assessment & Plan: Visit Diagnoses:  1. Arthritis of carpometacarpal (CMC) joint of right thumb   2. Pain in right hand   3. Trigger index finger of right hand    Plan: Bronda had some pain near the [facility] joint of the thumb, reviewed x-rays today which showed mild osteoarthritis.  Fortunately her pain has largely improved with conservative measures.  Recommended she can continue Voltaren gel and/or Tylenol as needed.  She has a history of right index trigger finger, but currently has not had any triggering episodes and is doing well.  Discussed preventative stretching for the fingers.  She will follow-up with me as needed.  Follow-up: Return if symptoms worsen or fail to improve.   Meds & Orders: No orders of the defined types were placed in this encounter.   Orders Placed This Encounter  Procedures   XR Hand Complete Right     Procedures: No procedures performed      Clinical History: No specialty comments available.  She reports that she has  quit smoking. She has never used smokeless tobacco. No results for input(s): "HGBA1C", "LABURIC" in the last 8760 hours.  Objective:   Vital Signs: There were no vitals taken for this visit.  Physical Exam  Gen: Well-appearing, in no acute distress; non-toxic CV:  Well-perfused. Warm.  Resp: Breathing unlabored on room air; no wheezing. Psych: Fluid speech in conversation; appropriate affect; normal thought process Neuro: Sensation intact throughout. No gross coordination deficits.   Ortho Exam - Right hand: No specific bony tenderness, negative CMC grind test.  There is full range of motion and no active triggering with open and close fist.  No UCL insufficiency.  Negative Finkelstein's test.  No redness or swelling. NVI.  Imaging: XR Hand Complete Right  Result Date: 12/01/2022 3 views of the right hand including AP, oblique and lateral film were ordered and reviewed by myself.  There is mild osteoarthritic change at the [facility] joint but certainly not advanced.  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  There is negative ulnar variance noted.  No other acute bony fracture or abnormality noted.   Past Medical/Family/Surgical/Social History: Medications & Allergies reviewed per EMR, new medications updated. There are no problems to display for this patient.  Past Medical History:  Diagnosis Date   Hypertension    Hypothyroidism    Pre-diabetes    Family History  Problem Relation Age of Onset   Breast cancer Neg  Hx    Past Surgical History:  Procedure Laterality Date   BREAST BIOPSY Right 03/19/2019   pash   BREAST EXCISIONAL BIOPSY Right 05/15/2019   biopsy changes.  Fibrocystic changes.  Usual duct epithelial    BREAST EXCISIONAL BIOPSY Left 1993   benign   BREAST EXCISIONAL BIOPSY Left 1995   benign   BREAST LUMPECTOMY WITH RADIOACTIVE SEED LOCALIZATION Right 05/15/2019   Procedure: RIGHT BREAST LUMPECTOMY WITH RADIOACTIVE SEED  LOCALIZATION;  Surgeon: Manus Rudd, MD;  Location: [facility];  Service: General;  Laterality: Right;   Social History   Occupational History   Not on file  Tobacco Use   Smoking status: Former   Smokeless tobacco: Never  Substance and Sexual Activity   Alcohol use: Not on file   Drug use: Not on file   Sexual activity: Not on file

## 2022-12-16 ENCOUNTER — Other Ambulatory Visit: Payer: Self-pay | Admitting: Family Medicine

## 2022-12-16 DIAGNOSIS — R42 Dizziness and giddiness: Secondary | ICD-10-CM | POA: Diagnosis not present

## 2022-12-16 DIAGNOSIS — E7800 Pure hypercholesterolemia, unspecified: Secondary | ICD-10-CM | POA: Diagnosis not present

## 2022-12-16 DIAGNOSIS — M810 Age-related osteoporosis without current pathological fracture: Secondary | ICD-10-CM | POA: Diagnosis not present

## 2022-12-16 DIAGNOSIS — E039 Hypothyroidism, unspecified: Secondary | ICD-10-CM | POA: Diagnosis not present

## 2022-12-16 DIAGNOSIS — Z0000 Encounter for general adult medical examination without abnormal findings: Secondary | ICD-10-CM | POA: Diagnosis not present

## 2022-12-16 DIAGNOSIS — D241 Benign neoplasm of right breast: Secondary | ICD-10-CM | POA: Diagnosis not present

## 2022-12-16 DIAGNOSIS — I129 Hypertensive chronic kidney disease with stage 1 through stage 4 chronic kidney disease, or unspecified chronic kidney disease: Secondary | ICD-10-CM | POA: Diagnosis not present

## 2022-12-16 DIAGNOSIS — N183 Chronic kidney disease, stage 3 unspecified: Secondary | ICD-10-CM | POA: Diagnosis not present

## 2022-12-16 DIAGNOSIS — R7303 Prediabetes: Secondary | ICD-10-CM | POA: Diagnosis not present

## 2023-01-21 DIAGNOSIS — Z23 Encounter for immunization: Secondary | ICD-10-CM | POA: Diagnosis not present

## 2023-02-02 ENCOUNTER — Other Ambulatory Visit: Payer: Self-pay | Admitting: Family Medicine

## 2023-02-02 DIAGNOSIS — M810 Age-related osteoporosis without current pathological fracture: Secondary | ICD-10-CM

## 2023-02-06 ENCOUNTER — Inpatient Hospital Stay: Admission: RE | Admit: 2023-02-06 | Payer: Medicare Other | Source: Ambulatory Visit

## 2023-03-21 ENCOUNTER — Other Ambulatory Visit: Payer: Self-pay | Admitting: Family Medicine

## 2023-03-21 DIAGNOSIS — Z1231 Encounter for screening mammogram for malignant neoplasm of breast: Secondary | ICD-10-CM

## 2023-05-04 ENCOUNTER — Ambulatory Visit
Admission: RE | Admit: 2023-05-04 | Discharge: 2023-05-04 | Disposition: A | Discharge status: Home / Self Care | Payer: Medicare Other | Source: Ambulatory Visit | Attending: Family Medicine | Admitting: Family Medicine

## 2023-05-04 DIAGNOSIS — Z1231 Encounter for screening mammogram for malignant neoplasm of breast: Secondary | ICD-10-CM | POA: Diagnosis not present

## 2023-06-14 DIAGNOSIS — I129 Hypertensive chronic kidney disease with stage 1 through stage 4 chronic kidney disease, or unspecified chronic kidney disease: Secondary | ICD-10-CM | POA: Diagnosis not present

## 2023-06-14 DIAGNOSIS — E7800 Pure hypercholesterolemia, unspecified: Secondary | ICD-10-CM | POA: Diagnosis not present

## 2023-06-14 DIAGNOSIS — E039 Hypothyroidism, unspecified: Secondary | ICD-10-CM | POA: Diagnosis not present

## 2023-06-14 DIAGNOSIS — R7303 Prediabetes: Secondary | ICD-10-CM | POA: Diagnosis not present

## 2023-06-14 DIAGNOSIS — N183 Chronic kidney disease, stage 3 unspecified: Secondary | ICD-10-CM | POA: Diagnosis not present

## 2023-06-16 DIAGNOSIS — M1711 Unilateral primary osteoarthritis, right knee: Secondary | ICD-10-CM | POA: Diagnosis not present

## 2023-06-16 DIAGNOSIS — E7800 Pure hypercholesterolemia, unspecified: Secondary | ICD-10-CM | POA: Diagnosis not present

## 2023-06-16 DIAGNOSIS — R7303 Prediabetes: Secondary | ICD-10-CM | POA: Diagnosis not present

## 2023-06-16 DIAGNOSIS — E039 Hypothyroidism, unspecified: Secondary | ICD-10-CM | POA: Diagnosis not present

## 2023-06-16 DIAGNOSIS — M810 Age-related osteoporosis without current pathological fracture: Secondary | ICD-10-CM | POA: Diagnosis not present

## 2023-06-16 DIAGNOSIS — N183 Chronic kidney disease, stage 3 unspecified: Secondary | ICD-10-CM | POA: Diagnosis not present

## 2023-06-16 DIAGNOSIS — I129 Hypertensive chronic kidney disease with stage 1 through stage 4 chronic kidney disease, or unspecified chronic kidney disease: Secondary | ICD-10-CM | POA: Diagnosis not present

## 2023-06-16 DIAGNOSIS — D241 Benign neoplasm of right breast: Secondary | ICD-10-CM | POA: Diagnosis not present

## 2023-06-21 DIAGNOSIS — E119 Type 2 diabetes mellitus without complications: Secondary | ICD-10-CM | POA: Diagnosis not present

## 2023-06-21 DIAGNOSIS — H25813 Combined forms of age-related cataract, bilateral: Secondary | ICD-10-CM | POA: Diagnosis not present

## 2023-08-16 DIAGNOSIS — R3121 Asymptomatic microscopic hematuria: Secondary | ICD-10-CM | POA: Diagnosis not present

## 2023-08-16 DIAGNOSIS — R7303 Prediabetes: Secondary | ICD-10-CM | POA: Diagnosis not present

## 2023-08-16 DIAGNOSIS — R809 Proteinuria, unspecified: Secondary | ICD-10-CM | POA: Diagnosis not present

## 2023-08-16 DIAGNOSIS — I129 Hypertensive chronic kidney disease with stage 1 through stage 4 chronic kidney disease, or unspecified chronic kidney disease: Secondary | ICD-10-CM | POA: Diagnosis not present

## 2023-08-16 DIAGNOSIS — N2581 Secondary hyperparathyroidism of renal origin: Secondary | ICD-10-CM | POA: Diagnosis not present

## 2023-08-16 DIAGNOSIS — N183 Chronic kidney disease, stage 3 unspecified: Secondary | ICD-10-CM | POA: Diagnosis not present

## 2023-08-17 ENCOUNTER — Other Ambulatory Visit: Payer: Self-pay | Admitting: Nephrology

## 2023-08-17 DIAGNOSIS — N183 Chronic kidney disease, stage 3 unspecified: Secondary | ICD-10-CM

## 2023-08-23 ENCOUNTER — Ambulatory Visit
Admission: RE | Admit: 2023-08-23 | Discharge: 2023-08-23 | Disposition: A | Discharge status: Home / Self Care | Source: Ambulatory Visit | Attending: Nephrology | Admitting: Nephrology

## 2023-08-23 DIAGNOSIS — N281 Cyst of kidney, acquired: Secondary | ICD-10-CM | POA: Diagnosis not present

## 2023-08-23 DIAGNOSIS — N183 Chronic kidney disease, stage 3 unspecified: Secondary | ICD-10-CM | POA: Diagnosis not present

## 2023-08-29 ENCOUNTER — Ambulatory Visit
Admission: RE | Admit: 2023-08-29 | Discharge: 2023-08-29 | Disposition: A | Discharge status: Home / Self Care | Payer: Medicare Other | Source: Ambulatory Visit | Attending: Family Medicine | Admitting: Family Medicine

## 2023-08-29 DIAGNOSIS — M810 Age-related osteoporosis without current pathological fracture: Secondary | ICD-10-CM

## 2023-08-29 DIAGNOSIS — M8588 Other specified disorders of bone density and structure, other site: Secondary | ICD-10-CM | POA: Diagnosis not present

## 2023-12-11 DIAGNOSIS — M8580 Other specified disorders of bone density and structure, unspecified site: Secondary | ICD-10-CM | POA: Diagnosis not present

## 2023-12-11 DIAGNOSIS — E7800 Pure hypercholesterolemia, unspecified: Secondary | ICD-10-CM | POA: Diagnosis not present

## 2023-12-11 DIAGNOSIS — I129 Hypertensive chronic kidney disease with stage 1 through stage 4 chronic kidney disease, or unspecified chronic kidney disease: Secondary | ICD-10-CM | POA: Diagnosis not present

## 2023-12-11 DIAGNOSIS — N183 Chronic kidney disease, stage 3 unspecified: Secondary | ICD-10-CM | POA: Diagnosis not present

## 2023-12-11 DIAGNOSIS — M8588 Other specified disorders of bone density and structure, other site: Secondary | ICD-10-CM | POA: Diagnosis not present

## 2023-12-11 DIAGNOSIS — R7303 Prediabetes: Secondary | ICD-10-CM | POA: Diagnosis not present

## 2023-12-11 DIAGNOSIS — E039 Hypothyroidism, unspecified: Secondary | ICD-10-CM | POA: Diagnosis not present

## 2023-12-15 DIAGNOSIS — I129 Hypertensive chronic kidney disease with stage 1 through stage 4 chronic kidney disease, or unspecified chronic kidney disease: Secondary | ICD-10-CM | POA: Diagnosis not present

## 2023-12-15 DIAGNOSIS — J309 Allergic rhinitis, unspecified: Secondary | ICD-10-CM | POA: Diagnosis not present

## 2023-12-15 DIAGNOSIS — Z0000 Encounter for general adult medical examination without abnormal findings: Secondary | ICD-10-CM | POA: Diagnosis not present

## 2023-12-15 DIAGNOSIS — E039 Hypothyroidism, unspecified: Secondary | ICD-10-CM | POA: Diagnosis not present

## 2023-12-15 DIAGNOSIS — R7303 Prediabetes: Secondary | ICD-10-CM | POA: Diagnosis not present

## 2023-12-15 DIAGNOSIS — E7800 Pure hypercholesterolemia, unspecified: Secondary | ICD-10-CM | POA: Diagnosis not present

## 2023-12-15 DIAGNOSIS — N183 Chronic kidney disease, stage 3 unspecified: Secondary | ICD-10-CM | POA: Diagnosis not present

## 2023-12-15 DIAGNOSIS — Z23 Encounter for immunization: Secondary | ICD-10-CM | POA: Diagnosis not present

## 2023-12-15 DIAGNOSIS — M8588 Other specified disorders of bone density and structure, other site: Secondary | ICD-10-CM | POA: Diagnosis not present

## 2024-01-20 DIAGNOSIS — Z23 Encounter for immunization: Secondary | ICD-10-CM | POA: Diagnosis not present

## 2024-02-28 DIAGNOSIS — N183 Chronic kidney disease, stage 3 unspecified: Secondary | ICD-10-CM | POA: Diagnosis not present

## 2024-02-28 DIAGNOSIS — N2581 Secondary hyperparathyroidism of renal origin: Secondary | ICD-10-CM | POA: Diagnosis not present

## 2024-02-28 DIAGNOSIS — R809 Proteinuria, unspecified: Secondary | ICD-10-CM | POA: Diagnosis not present

## 2024-02-28 DIAGNOSIS — R3121 Asymptomatic microscopic hematuria: Secondary | ICD-10-CM | POA: Diagnosis not present

## 2024-02-28 DIAGNOSIS — R7303 Prediabetes: Secondary | ICD-10-CM | POA: Diagnosis not present

## 2024-02-28 DIAGNOSIS — I129 Hypertensive chronic kidney disease with stage 1 through stage 4 chronic kidney disease, or unspecified chronic kidney disease: Secondary | ICD-10-CM | POA: Diagnosis not present

## 2024-03-25 ENCOUNTER — Other Ambulatory Visit: Payer: Self-pay | Admitting: Family Medicine

## 2024-03-25 DIAGNOSIS — Z1231 Encounter for screening mammogram for malignant neoplasm of breast: Secondary | ICD-10-CM

## 2024-05-07 ENCOUNTER — Ambulatory Visit
Admission: RE | Admit: 2024-05-07 | Discharge: 2024-05-07 | Disposition: A | Discharge status: Home / Self Care | Source: Ambulatory Visit | Attending: Family Medicine | Admitting: Family Medicine

## 2024-05-07 DIAGNOSIS — Z1231 Encounter for screening mammogram for malignant neoplasm of breast: Secondary | ICD-10-CM
